# Patient Record
Sex: Female | Born: 1965 | Race: White | Hispanic: No | Marital: Married | Smoking: Never smoker
Health system: Southern US, Community
[De-identification: ages and names within clinical notes are randomized; demographics above are authoritative.]

## PROBLEM LIST (undated history)

## (undated) DIAGNOSIS — H8109 Meniere's disease, unspecified ear: Secondary | ICD-10-CM

## (undated) DIAGNOSIS — G459 Transient cerebral ischemic attack, unspecified: Secondary | ICD-10-CM

## (undated) HISTORY — PX: BREAST CYST ASPIRATION: SHX578

---

## 2004-12-30 ENCOUNTER — Ambulatory Visit: Payer: Self-pay

## 2006-03-29 ENCOUNTER — Ambulatory Visit: Payer: Self-pay | Admitting: Family Medicine

## 2006-10-25 ENCOUNTER — Ambulatory Visit: Payer: Self-pay

## 2007-04-02 ENCOUNTER — Ambulatory Visit: Payer: Self-pay | Admitting: General Surgery

## 2008-07-09 ENCOUNTER — Ambulatory Visit: Payer: Self-pay

## 2009-07-10 ENCOUNTER — Ambulatory Visit: Payer: Self-pay | Admitting: General Surgery

## 2009-07-20 ENCOUNTER — Ambulatory Visit: Payer: Self-pay | Admitting: General Surgery

## 2010-02-08 ENCOUNTER — Ambulatory Visit: Payer: Self-pay

## 2011-02-03 ENCOUNTER — Ambulatory Visit: Payer: Self-pay | Admitting: Family Medicine

## 2011-02-08 ENCOUNTER — Ambulatory Visit: Payer: Self-pay | Admitting: Family Medicine

## 2012-01-10 DIAGNOSIS — L719 Rosacea, unspecified: Secondary | ICD-10-CM | POA: Insufficient documentation

## 2012-02-10 ENCOUNTER — Ambulatory Visit: Payer: Self-pay | Admitting: Family Medicine

## 2013-02-11 ENCOUNTER — Ambulatory Visit: Payer: Self-pay | Admitting: Family Medicine

## 2013-06-09 DIAGNOSIS — E010 Iodine-deficiency related diffuse (endemic) goiter: Secondary | ICD-10-CM | POA: Insufficient documentation

## 2013-06-09 DIAGNOSIS — H9319 Tinnitus, unspecified ear: Secondary | ICD-10-CM | POA: Insufficient documentation

## 2013-06-11 DIAGNOSIS — E559 Vitamin D deficiency, unspecified: Secondary | ICD-10-CM | POA: Insufficient documentation

## 2013-06-17 ENCOUNTER — Ambulatory Visit: Payer: Self-pay | Admitting: Family Medicine

## 2013-07-10 ENCOUNTER — Ambulatory Visit: Payer: Self-pay | Admitting: Otolaryngology

## 2013-08-07 DIAGNOSIS — R519 Headache, unspecified: Secondary | ICD-10-CM | POA: Insufficient documentation

## 2013-08-07 DIAGNOSIS — R200 Anesthesia of skin: Secondary | ICD-10-CM | POA: Insufficient documentation

## 2013-08-07 DIAGNOSIS — IMO0001 Reserved for inherently not codable concepts without codable children: Secondary | ICD-10-CM | POA: Insufficient documentation

## 2013-08-07 DIAGNOSIS — R51 Headache: Secondary | ICD-10-CM

## 2013-08-07 DIAGNOSIS — R202 Paresthesia of skin: Secondary | ICD-10-CM

## 2013-08-07 DIAGNOSIS — R531 Weakness: Secondary | ICD-10-CM | POA: Insufficient documentation

## 2013-08-07 DIAGNOSIS — R42 Dizziness and giddiness: Secondary | ICD-10-CM | POA: Insufficient documentation

## 2013-08-07 DIAGNOSIS — R6889 Other general symptoms and signs: Secondary | ICD-10-CM

## 2013-12-19 DIAGNOSIS — R232 Flushing: Secondary | ICD-10-CM | POA: Insufficient documentation

## 2013-12-19 DIAGNOSIS — E876 Hypokalemia: Secondary | ICD-10-CM | POA: Insufficient documentation

## 2013-12-22 ENCOUNTER — Emergency Department: Payer: Self-pay | Admitting: Emergency Medicine

## 2013-12-22 LAB — COMPREHENSIVE METABOLIC PANEL
ALBUMIN: 4.3 g/dL (ref 3.4–5.0)
ALK PHOS: 64 U/L
Anion Gap: 8 (ref 7–16)
BUN: 17 mg/dL (ref 7–18)
Bilirubin,Total: 0.5 mg/dL (ref 0.2–1.0)
CHLORIDE: 103 mmol/L (ref 98–107)
CREATININE: 0.76 mg/dL (ref 0.60–1.30)
Calcium, Total: 9.2 mg/dL (ref 8.5–10.1)
Co2: 29 mmol/L (ref 21–32)
EGFR (African American): >60
Glucose: 98 mg/dL (ref 65–99)
OSMOLALITY: 281 (ref 275–301)
Potassium: 3.5 mmol/L (ref 3.5–5.1)
SGOT(AST): 35 U/L (ref 15–37)
SGPT (ALT): 31 U/L
Sodium: 140 mmol/L (ref 136–145)
Total Protein: 7.7 g/dL (ref 6.4–8.2)

## 2013-12-22 LAB — CBC
HCT: 43 % (ref 35.0–47.0)
HGB: 14.1 g/dL (ref 12.0–16.0)
MCH: 30.6 pg (ref 26.0–34.0)
MCHC: 32.9 g/dL (ref 32.0–36.0)
MCV: 93 fL (ref 80–100)
Platelet: 266 10*3/uL (ref 150–440)
RBC: 4.62 10*6/uL (ref 3.80–5.20)
RDW: 12.1 % (ref 11.5–14.5)
WBC: 7.2 10*3/uL (ref 3.6–11.0)

## 2013-12-22 LAB — URINALYSIS, COMPLETE
BILIRUBIN, UR: NEGATIVE
Blood: NEGATIVE
Glucose,UR: NEGATIVE mg/dL (ref 0–75)
KETONE: NEGATIVE
Leukocyte Esterase: NEGATIVE
Nitrite: NEGATIVE
PH: 7 (ref 4.5–8.0)
Protein: NEGATIVE
RBC,UR: NONE SEEN /HPF (ref 0–5)
SPECIFIC GRAVITY: 1.008 (ref 1.003–1.030)
Squamous Epithelial: <1
WBC UR: NONE SEEN /HPF (ref 0–5)

## 2013-12-22 LAB — PROTIME-INR
INR: 0.9
Prothrombin Time: 12.4 secs (ref 11.5–14.7)

## 2014-01-22 ENCOUNTER — Ambulatory Visit: Payer: Self-pay | Admitting: Family Medicine

## 2015-02-11 ENCOUNTER — Other Ambulatory Visit: Payer: Self-pay | Admitting: Family Medicine

## 2015-02-11 DIAGNOSIS — Z1231 Encounter for screening mammogram for malignant neoplasm of breast: Secondary | ICD-10-CM

## 2015-02-11 DIAGNOSIS — E041 Nontoxic single thyroid nodule: Secondary | ICD-10-CM

## 2015-02-17 ENCOUNTER — Ambulatory Visit: Payer: Self-pay

## 2015-02-26 ENCOUNTER — Ambulatory Visit
Admission: RE | Admit: 2015-02-26 | Discharge: 2015-02-26 | Disposition: A | Discharge status: Home / Self Care | Payer: 59 | Source: Ambulatory Visit | Attending: Family Medicine | Admitting: Family Medicine

## 2015-02-26 DIAGNOSIS — Z1231 Encounter for screening mammogram for malignant neoplasm of breast: Secondary | ICD-10-CM | POA: Insufficient documentation

## 2015-02-26 DIAGNOSIS — E041 Nontoxic single thyroid nodule: Secondary | ICD-10-CM | POA: Insufficient documentation

## 2015-08-31 ENCOUNTER — Emergency Department
Admission: EM | Admit: 2015-08-31 | Discharge: 2015-08-31 | Disposition: A | Discharge status: Home / Self Care | Payer: 59 | Attending: Emergency Medicine | Admitting: Emergency Medicine

## 2015-08-31 ENCOUNTER — Encounter: Payer: Self-pay | Admitting: Emergency Medicine

## 2015-08-31 ENCOUNTER — Emergency Department: Payer: 59

## 2015-08-31 DIAGNOSIS — H8109 Meniere's disease, unspecified ear: Secondary | ICD-10-CM

## 2015-08-31 DIAGNOSIS — R42 Dizziness and giddiness: Secondary | ICD-10-CM

## 2015-08-31 DIAGNOSIS — H8102 Meniere's disease, left ear: Secondary | ICD-10-CM | POA: Insufficient documentation

## 2015-08-31 DIAGNOSIS — R110 Nausea: Secondary | ICD-10-CM

## 2015-08-31 DIAGNOSIS — Z79899 Other long term (current) drug therapy: Secondary | ICD-10-CM | POA: Diagnosis not present

## 2015-08-31 DIAGNOSIS — Z8673 Personal history of transient ischemic attack (TIA), and cerebral infarction without residual deficits: Secondary | ICD-10-CM | POA: Insufficient documentation

## 2015-08-31 HISTORY — DX: Meniere's disease, unspecified ear: H81.09

## 2015-08-31 HISTORY — DX: Transient cerebral ischemic attack, unspecified: G45.9

## 2015-08-31 LAB — URINALYSIS COMPLETE WITH MICROSCOPIC (ARMC ONLY)
BILIRUBIN URINE: NEGATIVE
Glucose, UA: NEGATIVE mg/dL
HGB URINE DIPSTICK: NEGATIVE
KETONES UR: NEGATIVE mg/dL
Leukocytes, UA: NEGATIVE
NITRITE: NEGATIVE
PH: 8 (ref 5.0–8.0)
Protein, ur: NEGATIVE mg/dL
SPECIFIC GRAVITY, URINE: 1.006 (ref 1.005–1.030)

## 2015-08-31 LAB — CBC
HEMATOCRIT: 44.2 % (ref 35.0–47.0)
HEMOGLOBIN: 15.5 g/dL (ref 12.0–16.0)
MCH: 32.2 pg (ref 26.0–34.0)
MCHC: 35.2 g/dL (ref 32.0–36.0)
MCV: 91.5 fL (ref 80.0–100.0)
Platelets: 271 10*3/uL (ref 150–440)
RBC: 4.83 MIL/uL (ref 3.80–5.20)
RDW: 12.7 % (ref 11.5–14.5)
WBC: 6.9 10*3/uL (ref 3.6–11.0)

## 2015-08-31 LAB — POCT PREGNANCY, URINE: Preg Test, Ur: NEGATIVE

## 2015-08-31 LAB — BASIC METABOLIC PANEL
ANION GAP: 9 (ref 5–15)
BUN: 19 mg/dL (ref 6–20)
CHLORIDE: 108 mmol/L (ref 101–111)
CO2: 22 mmol/L (ref 22–32)
Calcium: 9.6 mg/dL (ref 8.9–10.3)
Creatinine, Ser: 0.59 mg/dL (ref 0.44–1.00)
GFR calc Af Amer: >60 mL/min (ref >60)
GLUCOSE: 97 mg/dL (ref 65–99)
POTASSIUM: 3.7 mmol/L (ref 3.5–5.1)
SODIUM: 139 mmol/L (ref 135–145)

## 2015-08-31 LAB — GLUCOSE, CAPILLARY: Glucose-Capillary: 89 mg/dL (ref 65–99)

## 2015-08-31 LAB — TROPONIN I: Troponin I: <0.03 ng/mL (ref <0.03)

## 2015-08-31 MED ORDER — MECLIZINE HCL 25 MG PO TABS: 25.0000 mg | ORAL_TABLET | Freq: Three times a day (TID) | ORAL | Status: AC | PRN

## 2015-08-31 MED ORDER — HYDROCHLOROTHIAZIDE 12.5 MG PO TABS: 12.5000 mg | ORAL_TABLET | Freq: Every day | ORAL | Status: AC

## 2015-08-31 MED ORDER — GADOBENATE DIMEGLUMINE 529 MG/ML IV SOLN
20.0000 mL | Freq: Once | INTRAVENOUS | Status: AC | PRN
  Administered 2015-08-31: 17 mL via INTRAVENOUS

## 2015-08-31 MED ORDER — ONDANSETRON 4 MG PO TBDP: 4.0000 mg | ORAL_TABLET | Freq: Three times a day (TID) | ORAL | Status: DC | PRN

## 2015-08-31 MED ORDER — MECLIZINE HCL 25 MG PO TABS
25.0000 mg | ORAL_TABLET | Freq: Once | ORAL | Status: AC
  Administered 2015-08-31: 25 mg via ORAL
  Filled 2015-08-31: qty 1

## 2015-08-31 NOTE — Discharge Instructions (Signed)
Please return to the emergency department for severe pain, worsening dizziness, numbness tingling or weakness, vomiting, fever, or any other symptoms concerning to you.

## 2015-08-31 NOTE — ED Notes (Signed)
Patient transported to MRI 

## 2015-08-31 NOTE — ED Notes (Signed)
Pt presents to ED c/o dizziness and tingling that has worsened since Saturday. Pt states that this morning it was the worst  With 4 episodes of this. Pt presents with hx of meneires disease, migraine headaches, and TIA.

## 2015-08-31 NOTE — ED Provider Notes (Addendum)
[facility] Emergency Department Provider Note  ____________________________________________  Time seen: Approximately 3:24 PM  I have reviewed the triage vital signs and the nursing notes.   HISTORY  Chief Complaint Dizziness and Tingling    HPI Caroline Burns is a 50 y.o. female with a history of Mnire's disease, atypical migraines, and TIA presenting with dizziness and facial and left upper extremity tingling. The patient has been well controlled on hydrochlorothiazide daily, meclizine when necessary, for her Mnire's disease for the past 2 years; however, she self discontinued these medications approximately 8 months ago and has been asymptomatic until one week ago when she had a 20 minute episode where "I felt like I was falling." It started when she was standing, cleaning.  She had another mild episode Thursday, and then yesterday had 3 severe episodes of a sensation of falling, dizziness, with nausea but no vomiting. Her husband had to help her throughout their daily activities. This morning, she woke up with a pressure sensation on the left side of her head "it is not a pain." From 7a-7:20am she had another severe episode of dizziness and the sensation of falling which was worse with any movement. After the severe episode resolved, she continued to have mild dizziness and unsteady gait due to the sensation of falling. She also reports associated left facial tingling and left upper extremity tingling which is similar to her previous Mnire's. Comparatively, these episodes have been similar in character to her previous Mnire's as well as her atypical migraines which she occasionally has without any pain, with the exception that they have been much more severe, and she did not previously have any associated nausea. She denies any trauma, fever, recent cough or cold symptoms, ear pain, sore throat, or new medications. At this time, the patient states that she is  "much better" and is having mild dizziness. Her nausea has resolved after EMS gave her Zofran.   Past Medical History  Diagnosis Date  . TIA (transient ischemic attack)   . Meniere disease     There are no active problems to display for this patient.   Past Surgical History  Procedure Laterality Date  . Breast cyst aspiration Right     neg    Current Outpatient Rx  Name  Route  Sig  Dispense  Refill  . nortriptyline (PAMELOR) 10 MG capsule   Oral   Take 10 mg by mouth.         . hydrochlorothiazide (HYDRODIURIL) 12.5 MG tablet   Oral   Take 1 tablet (12.5 mg total) by mouth daily.   30 tablet   0   . meclizine (ANTIVERT) 25 MG tablet   Oral   Take 1 tablet (25 mg total) by mouth 3 (three) times daily as needed for dizziness.   20 tablet   0   . ondansetron (ZOFRAN ODT) 4 MG disintegrating tablet   Oral   Take 1 tablet (4 mg total) by mouth every 8 (eight) hours as needed for nausea or vomiting.   20 tablet   0     Allergies Codeine and Neomycin  Family History  Problem Relation Age of Onset  . Breast cancer Maternal Aunt     Social History Social History  Substance Use Topics  . Smoking status: Never Smoker   . Smokeless tobacco: None  . Alcohol Use: No    Review of Systems Constitutional: No fever/chills.Negative altered mental status. Eyes: No visual changes. No blurred or double  vision. ENT: No sore throat. No congestion or rhinorrhea. No ear pain. Cardiovascular: Denies chest pain. Denies palpitations. Respiratory: Denies shortness of breath.  No cough. Gastrointestinal: No abdominal pain.  Positive nausea, no vomiting.  No diarrhea.  No constipation. Genitourinary: Negative for dysuria. No urinary frequency. Musculoskeletal: Negative for back pain. Skin: Negative for rash. Neurological: Negative for headache but does have left-sided pressure. Positive for dizziness and unsteady gait due to her dizziness. Positive for tingling on the left  face and left upper extremity. Positive for generalized weakness. Psychiatric:Positive for anxiety  10-point ROS otherwise negative.  ____________________________________________   PHYSICAL EXAM:  VITAL SIGNS: ED Triage Vitals  Enc Vitals Group     BP 08/31/15 1248 124/69 mmHg     Pulse Rate 08/31/15 1248 71     Resp 08/31/15 1248 18     Temp 08/31/15 1248 98.2 F (36.8 C)     Temp Source 08/31/15 1248 Oral     SpO2 08/31/15 1248 100 %     Weight 08/31/15 1248 183 lb (83.008 kg)     Height 08/31/15 1248 5\' 3"  (1.6 m)     Head Cir --      Peak Flow --      Pain Score 08/31/15 1418 3     Pain Loc --      Pain Edu? --      Excl. in [place]? --     Constitutional: Alert and oriented. Well appearing and in no acute distress. Answers questions appropriately.Resting comfortably in the stretcher. Eyes: Conjunctivae are normal.  EOMI. PERRLA. No scleral icterus. Head: Atraumatic. Nose: No congestion/rhinnorhea. Mouth/Throat: Mucous membranes are moist.  Neck: No stridor.  Supple.  No meningismus. Full range of motion without pain. Cardiovascular: Normal rate, regular rhythm. No murmurs, rubs or gallops.  Respiratory: Normal respiratory effort.  No accessory muscle use or retractions. Lungs CTAB.  No wheezes, rales or ronchi. Gastrointestinal: Soft, nontender and nondistended.  No guarding or rebound.  No peritoneal signs. Musculoskeletal: No LE edema. No ttp in the calves or palpable cords.  Negative Homan's sign. Neurologic: Alert and oriented 3. Speech is clear. Face and smile symmetric. Tongue is midline. EOMI. PERRLA. No nystagmus. No pronator drift. 5 out of 5 grip, biceps, triceps, hip flexors, plantar flexion and dorsiflexion. Normal sensation to light touch in the bilateral upper and lower extremities, and face. Normal heel-to-shin. Patient became symptomatic when I sat her up and then her worse when I stood her up, so is unable to assess her gait in order to maintain her  safety. Skin:  Skin is warm, dry and intact. No rash noted. Psychiatric: Mood and affect are normal. Speech and behavior are normal.  Normal judgement.  ____________________________________________   LABS (all labs ordered are listed, but only abnormal results are displayed)  Labs Reviewed  URINALYSIS COMPLETEWITH MICROSCOPIC ([place]) - Abnormal; Notable for the following:    Color, Urine STRAW (*)    APPearance HAZY (*)    Bacteria, UA MANY (*)    Squamous Epithelial / LPF 0-5 (*)    All other components within normal limits  BASIC METABOLIC PANEL  CBC  TROPONIN I  GLUCOSE, CAPILLARY  POC URINE PREG, ED  POCT PREGNANCY, URINE   ____________________________________________  EKG  ED ECG REPORT I, Eula Listen, the attending physician, personally viewed and interpreted this ECG.   Date: 08/31/2015  EKG Time: 1259  Rate: 67  Rhythm: normal sinus rhythm  Axis: normal  Intervals:none  ST&T  Change: no STEMI  ____________________________________________  RADIOLOGY  Ct Head Wo Contrast  08/31/2015  CLINICAL DATA:  Dizziness, progressing for several days.  Headaches EXAM: CT HEAD WITHOUT CONTRAST TECHNIQUE: Contiguous axial images were obtained from the base of the skull through the vertex without intravenous contrast. COMPARISON:  December 22, 2013 FINDINGS: Brain: The ventricles are normal in size and configuration. There is no intracranial mass, hemorrhage, extra-axial fluid collection, or midline shift. Gray-white compartments are normal. Vascular: No vascular lesions are evident. Skull: The bony calvarium appears intact. Sinuses/Orbits: Visualized paranasal sinuses are clear. Visualized orbits appear symmetric bilaterally. Other: Mastoid air cells are clear. IMPRESSION: Study within normal limits. Electronically Signed   By: Lowella Grip III M.D.   On: 08/31/2015 13:22   Mr Jodene Nam Head Wo Contrast  08/31/2015  CLINICAL DATA:  50 year old female with dizziness  and unsteady gait. TIAs in the past. Dizziness. Meniere's disease. Subsequent encounter. EXAM: MRI HEAD WITHOUT AND WITH CONTRAST AND MRA HEAD WITHOUT CONTRAST AND MRI NECK WITHOUT AND WITH CONTRAST TECHNIQUE: Multiplanar, multiecho pulse sequences of the brain and surrounding structures were obtained without and with intravenous contrast. Angiographic images of the head were obtained using MRA technique without contrast. Multiplanar, multiecho pulse sequences of the neck and surrounding structures were obtained without and with intravenous contrast. CONTRAST:  61mL MULTIHANCE GADOBENATE DIMEGLUMINE 529 MG/ML IV SOLN COMPARISON:  08/31/2015 CT.  07/10/2013 MR. FINDINGS: MRI HEAD FINDINGS No acute infarct or intracranial hemorrhage. 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 No intracranial mass or abnormal enhancement. Cervical medullary junction, pituitary region, pineal region and orbital structures unremarkable. 2 cm polypoid opacification inferior aspect left maxillary sinus. Minimal mucosal thickening right maxillary sinus. Minimal partial opacification inferior left mastoid air cells. MRA HEAD FINDINGS Anterior circulation without medium or large size vessel significant stenosis or occlusion. Prominent appearance of the anterior communicating artery. Although this may be normal for this patient, small aneurysm is a possibility. This can be further assessed with CT angiogram if clinically desired. Fetal type contribution to the posterior cerebral arteries. No significant stenosis of either distal vertebral artery or of the basilar artery. Nonvisualized anterior inferior cerebellar arteries. Minimal branch vessel irregularity. MRI NECK FINDINGS There may be common origin of the innominate artery and left common carotid artery. No evidence the hemodynamically significant stenosis of either carotid bifurcation. Codominant vertebral arteries without significant stenosis. Slightly irregular  vertebral arteries but without significant irregularity to suggest underlying dysplasia. IMPRESSION: MRI HEAD No acute infarct or intracranial hemorrhage. 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 No intracranial mass or abnormal enhancement. 2 cm polypoid opacification inferior aspect left maxillary sinus. Minimal mucosal thickening right maxillary sinus. Minimal partial opacification inferior left mastoid air cells. MRA HEAD Anterior circulation without medium or large size vessel significant stenosis or occlusion. Prominent appearance of the anterior communicating artery. Although this may be normal for this patient, small aneurysm is a possibility. This can be further assessed with CT angiogram if clinically desired. Fetal type contribution to the posterior cerebral arteries. No significant stenosis of either distal vertebral artery or of the basilar artery. Nonvisualized anterior inferior cerebellar arteries. MRI NECK No evidence the hemodynamically significant stenosis of either carotid bifurcation. Codominant vertebral arteries without significant stenosis. Electronically Signed   By: Genia Del M.D.   On: 08/31/2015 20:47   Mr Angiogram Neck W Wo Contrast  08/31/2015  CLINICAL DATA:  50 year old female with dizziness and unsteady gait. TIAs in the past. Dizziness. Meniere's disease. Subsequent encounter.  EXAM: MRI HEAD WITHOUT AND WITH CONTRAST AND MRA HEAD WITHOUT CONTRAST AND MRI NECK WITHOUT AND WITH CONTRAST TECHNIQUE: Multiplanar, multiecho pulse sequences of the brain and surrounding structures were obtained without and with intravenous contrast. Angiographic images of the head were obtained using MRA technique without contrast. Multiplanar, multiecho pulse sequences of the neck and surrounding structures were obtained without and with intravenous contrast. CONTRAST:  33mL MULTIHANCE GADOBENATE DIMEGLUMINE 529 MG/ML IV SOLN COMPARISON:  08/31/2015 CT.  07/10/2013 MR.  FINDINGS: MRI HEAD FINDINGS No acute infarct or intracranial hemorrhage. 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 No intracranial mass or abnormal enhancement. Cervical medullary junction, pituitary region, pineal region and orbital structures unremarkable. 2 cm polypoid opacification inferior aspect left maxillary sinus. Minimal mucosal thickening right maxillary sinus. Minimal partial opacification inferior left mastoid air cells. MRA HEAD FINDINGS Anterior circulation without medium or large size vessel significant stenosis or occlusion. Prominent appearance of the anterior communicating artery. Although this may be normal for this patient, small aneurysm is a possibility. This can be further assessed with CT angiogram if clinically desired. Fetal type contribution to the posterior cerebral arteries. No significant stenosis of either distal vertebral artery or of the basilar artery. Nonvisualized anterior inferior cerebellar arteries. Minimal branch vessel irregularity. MRI NECK FINDINGS There may be common origin of the innominate artery and left common carotid artery. No evidence the hemodynamically significant stenosis of either carotid bifurcation. Codominant vertebral arteries without significant stenosis. Slightly irregular vertebral arteries but without significant irregularity to suggest underlying dysplasia. IMPRESSION: MRI HEAD No acute infarct or intracranial hemorrhage. 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 No intracranial mass or abnormal enhancement. 2 cm polypoid opacification inferior aspect left maxillary sinus. Minimal mucosal thickening right maxillary sinus. Minimal partial opacification inferior left mastoid air cells. MRA HEAD Anterior circulation without medium or large size vessel significant stenosis or occlusion. Prominent appearance of the anterior communicating artery. Although this may be normal for this patient, small aneurysm is a  possibility. This can be further assessed with CT angiogram if clinically desired. Fetal type contribution to the posterior cerebral arteries. No significant stenosis of either distal vertebral artery or of the basilar artery. Nonvisualized anterior inferior cerebellar arteries. MRI NECK No evidence the hemodynamically significant stenosis of either carotid bifurcation. Codominant vertebral arteries without significant stenosis. Electronically Signed   By: Genia Del M.D.   On: 08/31/2015 20:47   Mr Kizzie Fantasia Contrast  08/31/2015  CLINICAL DATA:  50 year old female with dizziness and unsteady gait. TIAs in the past. Dizziness. Meniere's disease. Subsequent encounter. EXAM: MRI HEAD WITHOUT AND WITH CONTRAST AND MRA HEAD WITHOUT CONTRAST AND MRI NECK WITHOUT AND WITH CONTRAST TECHNIQUE: Multiplanar, multiecho pulse sequences of the brain and surrounding structures were obtained without and with intravenous contrast. Angiographic images of the head were obtained using MRA technique without contrast. Multiplanar, multiecho pulse sequences of the neck and surrounding structures were obtained without and with intravenous contrast. CONTRAST:  75mL MULTIHANCE GADOBENATE DIMEGLUMINE 529 MG/ML IV SOLN COMPARISON:  08/31/2015 CT.  07/10/2013 MR. FINDINGS: MRI HEAD FINDINGS No acute infarct or intracranial hemorrhage. 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 No intracranial mass or abnormal enhancement. Cervical medullary junction, pituitary region, pineal region and orbital structures unremarkable. 2 cm polypoid opacification inferior aspect left maxillary sinus. Minimal mucosal thickening right maxillary sinus. Minimal partial opacification inferior left mastoid air cells. MRA HEAD FINDINGS Anterior circulation without medium or large size vessel significant stenosis or occlusion. Prominent appearance  of the anterior communicating artery. Although this may be normal for this patient, small  aneurysm is a possibility. This can be further assessed with CT angiogram if clinically desired. Fetal type contribution to the posterior cerebral arteries. No significant stenosis of either distal vertebral artery or of the basilar artery. Nonvisualized anterior inferior cerebellar arteries. Minimal branch vessel irregularity. MRI NECK FINDINGS There may be common origin of the innominate artery and left common carotid artery. No evidence the hemodynamically significant stenosis of either carotid bifurcation. Codominant vertebral arteries without significant stenosis. Slightly irregular vertebral arteries but without significant irregularity to suggest underlying dysplasia. IMPRESSION: MRI HEAD No acute infarct or intracranial hemorrhage. 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 No intracranial mass or abnormal enhancement. 2 cm polypoid opacification inferior aspect left maxillary sinus. Minimal mucosal thickening right maxillary sinus. Minimal partial opacification inferior left mastoid air cells. MRA HEAD Anterior circulation without medium or large size vessel significant stenosis or occlusion. Prominent appearance of the anterior communicating artery. Although this may be normal for this patient, small aneurysm is a possibility. This can be further assessed with CT angiogram if clinically desired. Fetal type contribution to the posterior cerebral arteries. No significant stenosis of either distal vertebral artery or of the basilar artery. Nonvisualized anterior inferior cerebellar arteries. MRI NECK No evidence the hemodynamically significant stenosis of either carotid bifurcation. Codominant vertebral arteries without significant stenosis. Electronically Signed   By: Genia Del M.D.   On: 08/31/2015 20:47    ____________________________________________   PROCEDURES  Procedure(s) performed: None  Critical Care performed:  No ____________________________________________   INITIAL IMPRESSION / ASSESSMENT AND PLAN / ED COURSE  Pertinent labs & imaging results that were available during my care of the patient were reviewed by me and considered in my medical decision making (see chart for details).  50 y.o. female with a history of Mnire's disease currently off her medications, atypical migraines, and TIA presenting with multiple episodes of dizziness, the sensation of falling, unsteady gait, left facial and left upper extremity tingling, nausea without vomiting. The patient has reassuring vital signs and I do not have any abnormalities on my neurologic examination except for that the patient become symptomatically she sits or stands up. She has no obvious ataxia or cerebellar dysfunction. It is possible that the patient has a recurrence of her Mnire's disease, and I will treat her with meclizine and reevaluate her symptoms. However, given her history of TIA and her age, I will get an MRI to evaluate for acute stroke, which is less likely but also possible. The patient's labs from triage are reassuring that her symptoms are not due to an electrolyte abnormality or severe anemia. Her urinalysis is pending. I will also get orthostatics, pregnancy test. If the patient's workup is reassuring, my plan is to be in touch with the patient's ENT physician who follows her Mnire's disease, and make an outpatient plan as long as the patient is safe to ambulate.  ----------------------------------------- 9:07 PM on 08/31/2015 -----------------------------------------  Throughout the patient's clinical course, she is continued to feel better and better. She noticed a significant improvement with meclizine. She was able to ambulate with minimal symptoms.  The patient underwent MRI and MRA of the head and neck, and there are no new findings. 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  She does not have any evidence of new posterior stroke.  At this time, the patient has been excluded from any acute  emergent neurologic conditions. I'll plan to send her home with treatment for her Mnire's disease, and she will call her ENT physician for close follow-up. I will place a call to her ENT physicians group, to let them know that she was here and the workup that she had done.  She and her husband understand return precautions as well as follow-up instructions.  ____________________________________________  FINAL CLINICAL IMPRESSION(S) / ED DIAGNOSES  Final diagnoses:  Meniere disease, unspecified laterality  Nausea without vomiting      NEW MEDICATIONS STARTED DURING THIS VISIT:  New Prescriptions   HYDROCHLOROTHIAZIDE (HYDRODIURIL) 12.5 MG TABLET    Take 1 tablet (12.5 mg total) by mouth daily.   MECLIZINE (ANTIVERT) 25 MG TABLET    Take 1 tablet (25 mg total) by mouth 3 (three) times daily as needed for dizziness.   ONDANSETRON (ZOFRAN ODT) 4 MG DISINTEGRATING TABLET    Take 1 tablet (4 mg total) by mouth every 8 (eight) hours as needed for nausea or vomiting.     Eula Listen, MD 08/31/15 2109  Eula Listen, MD 08/31/15 2113

## 2015-08-31 NOTE — ED Notes (Signed)
Pt returned from CT °

## 2015-10-14 DIAGNOSIS — G44229 Chronic tension-type headache, not intractable: Secondary | ICD-10-CM | POA: Insufficient documentation

## 2016-01-19 ENCOUNTER — Other Ambulatory Visit: Payer: Self-pay | Admitting: Family Medicine

## 2016-01-19 DIAGNOSIS — Z1231 Encounter for screening mammogram for malignant neoplasm of breast: Secondary | ICD-10-CM

## 2016-01-20 DIAGNOSIS — I639 Cerebral infarction, unspecified: Secondary | ICD-10-CM | POA: Insufficient documentation

## 2016-10-25 ENCOUNTER — Other Ambulatory Visit: Payer: Self-pay | Admitting: Acute Care

## 2016-10-25 DIAGNOSIS — R202 Paresthesia of skin: Secondary | ICD-10-CM

## 2016-10-25 DIAGNOSIS — G44229 Chronic tension-type headache, not intractable: Secondary | ICD-10-CM

## 2016-10-25 DIAGNOSIS — R531 Weakness: Secondary | ICD-10-CM

## 2016-10-25 DIAGNOSIS — R200 Anesthesia of skin: Secondary | ICD-10-CM

## 2016-10-25 DIAGNOSIS — R42 Dizziness and giddiness: Secondary | ICD-10-CM

## 2016-10-27 ENCOUNTER — Other Ambulatory Visit: Payer: Self-pay | Admitting: Acute Care

## 2016-10-27 DIAGNOSIS — R42 Dizziness and giddiness: Secondary | ICD-10-CM

## 2016-10-27 DIAGNOSIS — R531 Weakness: Secondary | ICD-10-CM

## 2016-10-27 DIAGNOSIS — G44229 Chronic tension-type headache, not intractable: Secondary | ICD-10-CM

## 2016-10-27 DIAGNOSIS — R202 Paresthesia of skin: Secondary | ICD-10-CM

## 2016-10-27 DIAGNOSIS — R200 Anesthesia of skin: Secondary | ICD-10-CM

## 2016-10-27 DIAGNOSIS — I729 Aneurysm of unspecified site: Secondary | ICD-10-CM

## 2016-10-31 ENCOUNTER — Ambulatory Visit: Payer: 59

## 2016-10-31 ENCOUNTER — Ambulatory Visit
Admission: RE | Admit: 2016-10-31 | Discharge: 2016-10-31 | Disposition: A | Discharge status: Home / Self Care | Payer: BLUE CROSS/BLUE SHIELD | Source: Ambulatory Visit | Attending: Acute Care | Admitting: Acute Care

## 2016-10-31 DIAGNOSIS — G44229 Chronic tension-type headache, not intractable: Secondary | ICD-10-CM

## 2016-10-31 DIAGNOSIS — R200 Anesthesia of skin: Secondary | ICD-10-CM

## 2016-10-31 DIAGNOSIS — I729 Aneurysm of unspecified site: Secondary | ICD-10-CM | POA: Insufficient documentation

## 2016-10-31 DIAGNOSIS — R42 Dizziness and giddiness: Secondary | ICD-10-CM | POA: Diagnosis present

## 2016-10-31 DIAGNOSIS — R202 Paresthesia of skin: Secondary | ICD-10-CM

## 2016-10-31 DIAGNOSIS — R531 Weakness: Secondary | ICD-10-CM

## 2016-10-31 MED ORDER — IOPAMIDOL (ISOVUE-370) INJECTION 76%
75.0000 mL | Freq: Once | INTRAVENOUS | Status: AC | PRN
  Administered 2016-10-31: 75 mL via INTRAVENOUS

## 2016-11-17 ENCOUNTER — Ambulatory Visit
Admission: RE | Admit: 2016-11-17 | Discharge: 2016-11-17 | Disposition: A | Discharge status: Home / Self Care | Payer: BLUE CROSS/BLUE SHIELD | Source: Ambulatory Visit | Attending: Family Medicine | Admitting: Family Medicine

## 2016-11-17 DIAGNOSIS — Z1231 Encounter for screening mammogram for malignant neoplasm of breast: Secondary | ICD-10-CM | POA: Diagnosis present

## 2016-12-06 ENCOUNTER — Other Ambulatory Visit: Payer: Self-pay | Admitting: Family Medicine

## 2016-12-06 DIAGNOSIS — E041 Nontoxic single thyroid nodule: Secondary | ICD-10-CM

## 2016-12-15 ENCOUNTER — Ambulatory Visit
Admission: RE | Admit: 2016-12-15 | Discharge: 2016-12-15 | Disposition: A | Discharge status: Home / Self Care | Payer: BLUE CROSS/BLUE SHIELD | Source: Ambulatory Visit | Attending: Family Medicine | Admitting: Family Medicine

## 2016-12-15 DIAGNOSIS — E041 Nontoxic single thyroid nodule: Secondary | ICD-10-CM | POA: Diagnosis not present

## 2017-05-23 DIAGNOSIS — L570 Actinic keratosis: Secondary | ICD-10-CM | POA: Insufficient documentation

## 2017-11-17 DIAGNOSIS — M5136 Other intervertebral disc degeneration, lumbar region: Secondary | ICD-10-CM | POA: Insufficient documentation

## 2017-12-05 ENCOUNTER — Other Ambulatory Visit: Payer: Self-pay | Admitting: Family Medicine

## 2017-12-05 DIAGNOSIS — Z1231 Encounter for screening mammogram for malignant neoplasm of breast: Secondary | ICD-10-CM

## 2017-12-19 ENCOUNTER — Ambulatory Visit: Payer: BLUE CROSS/BLUE SHIELD

## 2017-12-26 ENCOUNTER — Ambulatory Visit
Admission: RE | Admit: 2017-12-26 | Discharge: 2017-12-26 | Disposition: A | Discharge status: Home / Self Care | Payer: BLUE CROSS/BLUE SHIELD | Source: Ambulatory Visit | Attending: Family Medicine | Admitting: Family Medicine

## 2017-12-26 ENCOUNTER — Encounter (INDEPENDENT_AMBULATORY_CARE_PROVIDER_SITE_OTHER): Payer: Self-pay

## 2017-12-26 DIAGNOSIS — Z1231 Encounter for screening mammogram for malignant neoplasm of breast: Secondary | ICD-10-CM | POA: Diagnosis present

## 2018-01-05 DIAGNOSIS — E6609 Other obesity due to excess calories: Secondary | ICD-10-CM | POA: Insufficient documentation

## 2018-01-05 DIAGNOSIS — Z6833 Body mass index (BMI) 33.0-33.9, adult: Secondary | ICD-10-CM

## 2018-01-31 ENCOUNTER — Ambulatory Visit (INDEPENDENT_AMBULATORY_CARE_PROVIDER_SITE_OTHER): Payer: BLUE CROSS/BLUE SHIELD | Admitting: Podiatry

## 2018-01-31 ENCOUNTER — Other Ambulatory Visit: Payer: Self-pay | Admitting: Podiatry

## 2018-01-31 ENCOUNTER — Ambulatory Visit (INDEPENDENT_AMBULATORY_CARE_PROVIDER_SITE_OTHER): Payer: BLUE CROSS/BLUE SHIELD

## 2018-01-31 ENCOUNTER — Encounter: Payer: Self-pay | Admitting: Podiatry

## 2018-01-31 VITALS — BP 123/75 | HR 68 | Resp 16

## 2018-01-31 DIAGNOSIS — M2012 Hallux valgus (acquired), left foot: Secondary | ICD-10-CM

## 2018-01-31 DIAGNOSIS — M2011 Hallux valgus (acquired), right foot: Secondary | ICD-10-CM | POA: Diagnosis not present

## 2018-01-31 DIAGNOSIS — M21622 Bunionette of left foot: Secondary | ICD-10-CM | POA: Diagnosis not present

## 2018-01-31 DIAGNOSIS — M79672 Pain in left foot: Secondary | ICD-10-CM

## 2018-01-31 DIAGNOSIS — G43909 Migraine, unspecified, not intractable, without status migrainosus: Secondary | ICD-10-CM | POA: Insufficient documentation

## 2018-01-31 DIAGNOSIS — M79671 Pain in right foot: Secondary | ICD-10-CM

## 2018-01-31 DIAGNOSIS — I499 Cardiac arrhythmia, unspecified: Secondary | ICD-10-CM | POA: Insufficient documentation

## 2018-01-31 DIAGNOSIS — J302 Other seasonal allergic rhinitis: Secondary | ICD-10-CM | POA: Insufficient documentation

## 2018-01-31 NOTE — Patient Instructions (Addendum)
Bunion A bunion is a bump on the base of the big toe that forms when the bones of the big toe joint move out of position. Bunions may be small at first, but they often get larger over time. The can make walking painful. What are the causes? A bunion may be caused by:  Wearing narrow or pointed shoes that force the big toe to press against the other toes.  Abnormal foot development that causes the foot to roll inward (pronate).  Changes in the foot that are caused by certain diseases, such as rheumatoid arthritis and polio.  A foot injury.  What increases the risk? The following factors may make you more likely to develop this condition:  Wearing shoes that squeeze the toes together.  Having certain diseases, such as: ? Rheumatoid arthritis. ? Polio. ? Cerebral palsy.  Having family members who have bunions.  Being born with a foot deformity, such as flat feet or low arches.  Doing activities that put a lot of pressure on the feet, such as ballet dancing.  What are the signs or symptoms? The main symptom of a bunion is a noticeable bump on the big toe. Other symptoms may include:  Pain.  Swelling around the big toe.  Redness and inflammation.  Thick or hardened skin on the big toe or between the toes.  Stiffness or loss of motion in the big toe.  Trouble with walking.  How is this diagnosed? A bunion may be diagnosed based on your symptoms, medical history, and activities. You may have tests, such as:  X-rays. These allow your health care provider to check the position of the bones in your foot and look for damage to your joint. They also help your health care provider to determine the severity of your bunion and the best way to treat it.  Joint aspiration. In this test, a sample of fluid is removed from the toe joint. This test, which may be done if you are in a lot of pain, helps to rule out diseases that cause painful swelling of the joints, such as  arthritis.  How is this treated? There is no cure for a bunion, but treatment can help to prevent a bunion from getting worse. Treatment depends on the severity of your symptoms. Your health care provider may recommend:  Wearing shoes that have a wide toe box.  Using bunion pads to cushion the affected area.  Taping your toes together to keep them in a normal position.  Placing a device inside your shoe (orthotics) to help reduce pressure on your toe joint.  Taking medicine to ease pain, inflammation, and swelling.  Applying heat or ice to the affected area.  Doing stretching exercises.  Surgery to remove scar tissue and move the toes back into their normal position. This treatment is rare.  Follow these instructions at home:  Support your toe joint with proper footwear, shoe padding, or taping as told by your health care provider.  Take over-the-counter and prescription medicines only as told by your health care provider.  If directed, apply ice to the injured area: ? Put ice in a plastic bag. ? Place a towel between your skin and the bag. ? Leave the ice on for 20 minutes, 2-3 times per day.  If directed, apply heat to the affected area before you exercise. Use the heat source that your health care provider recommends, such as a moist heat pack or a heating pad. ? Place a towel between your   skin and the heat source. ? Leave the heat on for 20-30 minutes. ? Remove the heat if your skin turns bright red. This is especially important if you are unable to feel pain, heat, or cold. You may have a greater risk of getting burned.  Do exercises as told by your health care provider.  Keep all follow-up visits as told by your health care provider. Contact a health care provider if:  Your symptoms get worse.  Your symptoms do not improve in 2 weeks. Get help right away if:  You have severe pain and trouble with walking. This information is not intended to replace advice given  to you by your health care provider. Make sure you discuss any questions you have with your health care provider. Document Released: 02/07/2005 Document Revised: 07/16/2015 Document Reviewed: 09/07/2014 Elsevier Interactive Patient Education  2018 Elsevier Inc[employer]  Pre-Operative Instructions  Congratulations, you have decided to take an important step towards improving your quality of life.  You can be assured that the doctors and staff at [facility] will be with you every step of the way.  Here are some important things you should know:  1. Plan to be at the surgery center/hospital at least 1 (one) hour prior to your scheduled time, unless otherwise directed by the surgical center/hospital staff.  You must have a responsible adult accompany you, remain during the surgery and drive you home.  Make sure you have directions to the surgical center/hospital to ensure you arrive on time. 2. If you are having surgery at [facility] or [facility], you will need a copy of your medical history and physical form from your family physician within one month prior to the date of surgery. We will give you a form for your primary physician to complete.  3. We make every effort to accommodate the date you request for surgery.  However, there are times where surgery dates or times have to be moved.  We will contact you as soon as possible if a change in schedule is required.   4. No aspirin/ibuprofen for one week before surgery.  If you are on aspirin, any non-steroidal anti-inflammatory medications (Mobic, Aleve, Ibuprofen) should not be taken seven (7) days prior to your surgery.  You make take Tylenol for pain prior to surgery.  5. Medications - If you are taking daily heart and blood pressure medications, seizure, reflux, allergy, asthma, anxiety, pain or diabetes medications, make sure you notify the surgery center/hospital before the day of surgery so they can tell you which medications you should  take or avoid the day of surgery. 6. No food or drink after midnight the night before surgery unless directed otherwise by surgical center/hospital staff. 7. No alcoholic beverages 24-hours prior to surgery.  No smoking 24-hours prior or 24-hours after surgery. 8. Wear loose pants or shorts. They should be loose enough to fit over bandages, boots, and casts. 9. Don't wear slip-on shoes. Sneakers are preferred. 10. Bring your boot with you to the surgery center/hospital.  Also bring crutches or a walker if your physician has prescribed it for you.  If you do not have this equipment, it will be provided for you after surgery. 11. If you have not been contacted by the surgery center/hospital by the day before your surgery, call to confirm the date and time of your surgery. 12. Leave-time from work may vary depending on the type of surgery you have.  Appropriate arrangements should be made prior   to surgery with your employer. 13. Prescriptions will be provided immediately following surgery by your doctor.  Fill these as soon as possible after surgery and take the medication as directed. Pain medications will not be refilled on weekends and must be approved by the doctor. 14. Remove nail polish on the operative foot and avoid getting pedicures prior to surgery. 15. Wash the night before surgery.  The night before surgery wash the foot and leg well with water and the antibacterial soap provided. Be sure to pay special attention to beneath the toenails and in between the toes.  Wash for at least three (3) minutes. Rinse thoroughly with water and dry well with a towel.  Perform this wash unless told not to do so by your physician.  Enclosed: 1 Ice pack (please put in freezer the night before surgery)   1 Hibiclens skin cleaner   Pre-op instructions  If you have any questions regarding the instructions, please do not hesitate to call our office.  [place]: [address], [place], [place] [address] --  [phone]  [place]: [address] [place], [place] [address] -- [phone]  [place]: [address]  [place], [place] [address] -- [phone]  [place]: [address], [address], [place], [place] [address] -- [phone]  Website: [email or web address]  

## 2018-01-31 NOTE — Progress Notes (Signed)
Subjective:   Patient ID: Caroline Burns, female   DOB: 52 y.o.   MRN: [id number]   HPI Patient presents with painful bunion deformity left and pain in the outside of the left foot on the bone.  Also has arch pain and does wear orthotics and states that she has flatfoot structure and has history of bunion deformity in the family.  Patient states is been going on several years she is tried wider shoes she is tried soaks without relief of her symptoms.  Patient does not smoke and likes to be active   Review of Systems  All other systems reviewed and are negative.       Objective:  Physical Exam  Constitutional: She appears well-developed and well-nourished.  Cardiovascular: Intact distal pulses.  Pulmonary/Chest: Effort normal.  Musculoskeletal: Normal range of motion.  Neurological: She is alert.  Skin: Skin is warm.  Nursing note and vitals reviewed.   Neurovascular status intact muscle strength is adequate range of motion within normal limits with patient found to have hyperostosis medial aspect first metatarsal head left lateral side fifth metatarsal left with redness around both areas with pain with palpation.  Patient is noted to have good digital perfusion well oriented x3 with mild arch pain and depression of the arch noted bilateral.     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  I have recommended surgical intervention and I reviewed this with patient and explained distal osteotomy along with metatarsal osteotomy fifth.  Patient wants surgery and needs to wait till January and at this time I educated her on the surgery and what would be required and she will reappoint for consult and have surgery tentatively the first several weeks of January.  She is encouraged to call with any questions she would have prior to her next  visit  X-ray indicates elevation of the intermetatarsal angle left over right and irritation around the fifth metatarsal head left with moderate flatfoot deformity noted

## 2018-01-31 NOTE — Progress Notes (Signed)
   Subjective:    Patient ID: Caroline Burns, female    DOB: 1966-02-11, 52 y.o.   MRN: [id number]  HPI    Review of Systems  All other systems reviewed and are negative.      Objective:   Physical Exam        Assessment & Plan:

## 2018-01-31 NOTE — Progress Notes (Signed)
dg 

## 2018-02-23 ENCOUNTER — Ambulatory Visit (INDEPENDENT_AMBULATORY_CARE_PROVIDER_SITE_OTHER): Payer: BLUE CROSS/BLUE SHIELD | Admitting: Podiatry

## 2018-02-23 ENCOUNTER — Encounter: Payer: Self-pay | Admitting: Podiatry

## 2018-02-23 DIAGNOSIS — M2011 Hallux valgus (acquired), right foot: Secondary | ICD-10-CM

## 2018-02-23 DIAGNOSIS — M21622 Bunionette of left foot: Secondary | ICD-10-CM

## 2018-02-23 NOTE — Patient Instructions (Signed)
Pre-Operative Instructions  Congratulations, you have decided to take an important step towards improving your quality of life.  You can be assured that the doctors and staff at [facility] will be with you every step of the way.  Here are some important things you should know:  1. Plan to be at the surgery center/hospital at least 1 (one) hour prior to your scheduled time, unless otherwise directed by the surgical center/hospital staff.  You must have a responsible adult accompany you, remain during the surgery and drive you home.  Make sure you have directions to the surgical center/hospital to ensure you arrive on time. 2. If you are having surgery at [facility] or [facility], you will need a copy of your medical history and physical form from your family physician within one month prior to the date of surgery. We will give you a form for your primary physician to complete.  3. We make every effort to accommodate the date you request for surgery.  However, there are times where surgery dates or times have to be moved.  We will contact you as soon as possible if a change in schedule is required.   4. No aspirin/ibuprofen for one week before surgery.  If you are on aspirin, any non-steroidal anti-inflammatory medications (Mobic, Aleve, Ibuprofen) should not be taken seven (7) days prior to your surgery.  You make take Tylenol for pain prior to surgery.  5. Medications - If you are taking daily heart and blood pressure medications, seizure, reflux, allergy, asthma, anxiety, pain or diabetes medications, make sure you notify the surgery center/hospital before the day of surgery so they can tell you which medications you should take or avoid the day of surgery. 6. No food or drink after midnight the night before surgery unless directed otherwise by surgical center/hospital staff. 7. No alcoholic beverages 24-hours prior to surgery.  No smoking 24-hours prior or 24-hours after  surgery. 8. Wear loose pants or shorts. They should be loose enough to fit over bandages, boots, and casts. 9. Don't wear slip-on shoes. Sneakers are preferred. 10. Bring your boot with you to the surgery center/hospital.  Also bring crutches or a walker if your physician has prescribed it for you.  If you do not have this equipment, it will be provided for you after surgery. 11. If you have not been contacted by the surgery center/hospital by the day before your surgery, call to confirm the date and time of your surgery. 12. Leave-time from work may vary depending on the type of surgery you have.  Appropriate arrangements should be made prior to surgery with your employer. 13. Prescriptions will be provided immediately following surgery by your doctor.  Fill these as soon as possible after surgery and take the medication as directed. Pain medications will not be refilled on weekends and must be approved by the doctor. 14. Remove nail polish on the operative foot and avoid getting pedicures prior to surgery. 15. Wash the night before surgery.  The night before surgery wash the foot and leg well with water and the antibacterial soap provided. Be sure to pay special attention to beneath the toenails and in between the toes.  Wash for at least three (3) minutes. Rinse thoroughly with water and dry well with a towel.  Perform this wash unless told not to do so by your physician.  Enclosed: 1 Ice pack (please put in freezer the night before surgery)   1 Hibiclens skin cleaner     Pre-op instructions  If you have any questions regarding the instructions, please do not hesitate to call our office.  [place]: [address], [place], [place] [address] -- [phone]  [place]: [address] [place], [place] [address] -- [phone]  [place]: [address]  [place], [place] [address] -- [phone]  [place]: [address], [place], [place] [address] -- [phone]  Website:  [email or web address] 

## 2018-02-24 NOTE — Progress Notes (Signed)
Subjective:   Patient ID: Caroline Burns, female   DOB: 53 y.o.   MRN: [id number]   HPI Patient presents stating that she is having some pain in her neck and she is going to the doctor on Monday and we may have to hold off for a week or 2 on the surgery.  Patient states she is very motivated to get her foot fixed   ROS      Objective:  Physical Exam  Neurovascular status intact with patient noted to have prominence of the first metatarsal left fifth metatarsal left with redness around the joint surfaces and pain with palpation.  Patient is found to have good digital perfusion and is well oriented x3     Assessment:  HAV deformity left tailor's bunion deformity left with possibility of some kind of strain of her neck left that she is having checked     Plan:  H&P performed reviewed x-rays and allowed her to read consent form going over alternative treatments complications associated with procedures.  Patient wants surgery and after extensive review of signs the consent form understanding told her correction can take 6 months to 1 year.  Patient is scheduled for outpatient surgery and will call us with the results from seeing her family physician on Monday and we may hold off and wait several weeks to do the procedure.  Patient is encouraged to call us with any questions concerns and will have this performed at one point in January depending on the results of her neck issues.  Patient had air fracture walker dispensed with all instructions on usage and she will get used to it prior to procedure  X-rays indicate that there is structural deformity first and fifth metatarsal left which was reviewed with patient

## 2018-02-26 ENCOUNTER — Telehealth: Payer: Self-pay | Admitting: *Deleted

## 2018-02-26 NOTE — Telephone Encounter (Signed)
"  I was scheduled to have surgery tomorrow but Dr. Paulla Dolly wanted me to have my neck checked before I preceded.  I went for my appointment and my doctor said he does not recommend that I have surgery right now.  He said he wants to run some additional tests.  I will call to reschedule once I find out the results."  I'll let Dr. Paulla Dolly know and I hope everything goes well.  "Thank you so much."

## 2018-03-01 ENCOUNTER — Other Ambulatory Visit: Payer: Self-pay | Admitting: Medical Oncology

## 2018-03-01 DIAGNOSIS — R221 Localized swelling, mass and lump, neck: Secondary | ICD-10-CM

## 2018-03-01 DIAGNOSIS — R42 Dizziness and giddiness: Secondary | ICD-10-CM

## 2018-03-05 ENCOUNTER — Other Ambulatory Visit: Payer: Self-pay | Admitting: Medical Oncology

## 2018-03-05 DIAGNOSIS — R221 Localized swelling, mass and lump, neck: Secondary | ICD-10-CM

## 2018-03-05 DIAGNOSIS — R42 Dizziness and giddiness: Secondary | ICD-10-CM

## 2018-03-06 ENCOUNTER — Ambulatory Visit
Admission: RE | Admit: 2018-03-06 | Discharge: 2018-03-06 | Disposition: A | Discharge status: Home / Self Care | Payer: BLUE CROSS/BLUE SHIELD | Source: Ambulatory Visit | Attending: Medical Oncology | Admitting: Medical Oncology

## 2018-03-06 DIAGNOSIS — R42 Dizziness and giddiness: Secondary | ICD-10-CM | POA: Diagnosis present

## 2018-03-06 DIAGNOSIS — R221 Localized swelling, mass and lump, neck: Secondary | ICD-10-CM | POA: Insufficient documentation

## 2018-03-07 ENCOUNTER — Other Ambulatory Visit: Payer: BLUE CROSS/BLUE SHIELD

## 2018-03-13 ENCOUNTER — Telehealth: Payer: Self-pay | Admitting: Podiatry

## 2018-03-13 NOTE — Telephone Encounter (Signed)
My surgery on my left foot was postponed to have testing done on my throat. I wanted to know if I needed to see the neurologist before surgery as everything else has come back clear.

## 2018-03-14 ENCOUNTER — Telehealth: Payer: Self-pay | Admitting: *Deleted

## 2018-03-14 NOTE — Telephone Encounter (Signed)
"  I'm calling to let you know that Dr. Paulla Dolly said you do not need to see a Neurologist.  He said you can schedule your surgery.  He actually had a cancellation for next week, if you would like to do it then.  "That would be great.  I have a new grand-baby coming soon, so I'd like to go ahead and get this over with."  I'll get you scheduled.  "Will someone from the surgery center call me on Monday and give me my time?"  Yes, either Monday or this Friday, someone will give you a call.

## 2018-03-14 NOTE — Telephone Encounter (Signed)
"  I just got off the phone with my primary care doctor.  He said he does not want me to have surgery until after my testing with a Neurologist due to my past history of mini strokes.  My appointment with the Neurologist isn't until February 28.  So, I'm going to hold off on surgery for now."  I'll cancel the surgery and I'll let Dr. Paulla Dolly know.  I canceled the surgery in [facility] [employer].

## 2018-03-20 ENCOUNTER — Telehealth: Payer: Self-pay | Admitting: *Deleted

## 2018-03-20 NOTE — Telephone Encounter (Signed)
Pt called states she would like another shot in her foot, since she is having to wait another month for her surgery.

## 2018-03-21 ENCOUNTER — Encounter: Payer: Self-pay | Admitting: Podiatry

## 2018-03-21 ENCOUNTER — Ambulatory Visit (INDEPENDENT_AMBULATORY_CARE_PROVIDER_SITE_OTHER): Payer: BLUE CROSS/BLUE SHIELD | Admitting: Podiatry

## 2018-03-21 DIAGNOSIS — D3613 Benign neoplasm of peripheral nerves and autonomic nervous system of lower limb, including hip: Secondary | ICD-10-CM | POA: Diagnosis not present

## 2018-03-21 DIAGNOSIS — M779 Enthesopathy, unspecified: Secondary | ICD-10-CM

## 2018-03-21 DIAGNOSIS — D3610 Benign neoplasm of peripheral nerves and autonomic nervous system, unspecified: Secondary | ICD-10-CM

## 2018-03-21 MED ORDER — TRIAMCINOLONE ACETONIDE 10 MG/ML IJ SUSP
10.0000 mg | Freq: Once | INTRAMUSCULAR | Status: AC
  Administered 2018-03-21: 10 mg

## 2018-03-21 NOTE — Telephone Encounter (Signed)
I informed pt of Dr. Mellody Drown offer of an appt today. Pt accepted appt today at 3:15pm.

## 2018-03-21 NOTE — Telephone Encounter (Signed)
She can come in today 

## 2018-03-22 NOTE — Progress Notes (Signed)
Subjective:   Patient ID: Caroline Burns, female   DOB: 53 y.o.   MRN: [id number]   HPI Patient presents stating she is very excited to have surgery on her left foot that is good I have to hold off until March and she is having a lot of pain and is developed some radiating pains between her second and third toes and also has a lot of discomfort on the outside of the left bone stating that so far all of her test have come back negative but she is due to see a neurologist the end of the month   ROS      Objective:  Physical Exam  Neurovascular status was intact with patient found to have inflammation second interspace left with mild shooting into the adjacent digits of relative short-term duration.  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  I went ahead did a sterile prep of the left second interspace and I did an injection of the nerve with 3 mg Dexasone Kenalog 5 mg Xylocaine and I then prepped the fifth MPJ left I did careful injection of 2 mg dexamethasone Kenalog 5 mg Xylocaine to reduce inflammation.  Patient will be seen back to recheck

## 2018-04-10 ENCOUNTER — Telehealth: Payer: Self-pay | Admitting: *Deleted

## 2018-04-10 NOTE — Telephone Encounter (Signed)
"  I just left my Neurologist.  He said I was cleared to have surgery.  All of my test have come back negative.  I am cleared to have surgery.  So, I'm calling to see if I can get my surgery scheduled with Dr. Paulla Dolly."  Can you call your doctors' offices and get them to release your results to [place]?  "Yes, I'll give them a call."  "I'm calling to let them know I've been passed to have the surgery.  I was told I need to get them to fill out a form or something.  What do I need to do about it?  Does it need to be sent to me?  Give me a call."

## 2018-04-11 NOTE — Telephone Encounter (Signed)
I am returning.  We do not have a form that needs to be filled out.  We just need a statement from the doctor stating you are cleared to have the surgery.  They can fax [place] the clinicals or a statement stating you are cleared.  "Oh okay, you just need something written stating I'm cleared to have the surgery.  I will let my primary doctor know.  Is the pcp the only one you need it from?"  Dr. Paulla Dolly would like it from your Neurologist as well.  Our fax number is [phone].  "Okay, I'll get them to send it to you."

## 2018-04-11 NOTE — Telephone Encounter (Signed)
"  I need a form submitted that I can send to my primary doctor to have them fill out saying I'm okay to pass for the surgery I need on my foot.  I'm not sure what type of form.  They told me there was a form you guys need to send them."

## 2018-04-16 ENCOUNTER — Telehealth: Payer: Self-pay | Admitting: *Deleted

## 2018-04-16 NOTE — Telephone Encounter (Signed)
"  I'm calling back in reference to the clearance I need for my foot surgery.  [facility], which is my regular doctor, said they don't have access to my ENT doctor, Dr. Lula Olszewski nor the Neurologist, at [facility] but Dr. Paulla Dolly has access.  So I do not have anything else I can do to clear unless you give me letter requesting it.  Give me a call back."

## 2018-04-20 ENCOUNTER — Encounter: Payer: Self-pay | Admitting: *Deleted

## 2018-04-20 NOTE — Telephone Encounter (Signed)
"  I am just wondering if Dr. Paulla Burns had a chance to view my notes from my Neurologist at [facility], also the ENT doctor, and also did you get anything from [facility] for the okay to go ahead and have my surgery.  I'm just waiting to hear from you guys when the appointment will be.  Thank you."  I am returning your call.  We can see your ultrasound results.  We can see your messages with [facility] Everywhere.  They are waiting on a response from the Neurologist before they will give clearance for your surgery.  I sent a medical clearance letter to Dr. Salome Burns at [facility] and your Neurologist, Dr. Melrose Burns.  Once we get the clearance we will call you to get the surgery scheduled.  "Okay, so I guess there is nothing else I need to do on my part.  I guess it's just a wait and see response."  We will call you once we get a response.

## 2018-04-23 NOTE — Telephone Encounter (Signed)
We should be able to get this booked soon as it sounds like her test results have turned out fine. Please follow up

## 2018-04-27 ENCOUNTER — Telehealth: Payer: Self-pay | Admitting: *Deleted

## 2018-04-27 NOTE — Telephone Encounter (Signed)
"  I am calling to see if you received the information you needed from [facility].  Have they faxed you anything and has anything gone through?  If not may need to call them back or whatever to get on the case again.  Call me back."

## 2018-05-04 NOTE — Telephone Encounter (Signed)
"  I was just wondering if they received the papers from all the testing I had done since January and February.  I haven't heard from anyone in a while.  Is the surgery going to be soon?  Are they waiting for a schedule?  Is it going to be a while?  If someone would let me know, I'd appreciate it."

## 2018-05-07 NOTE — Telephone Encounter (Addendum)
I am returning your call.  We still have not heard anything from Dr. Iona Beard.  I sent Dr. Iona Beard a medical clearance letter and I haven't received a response.  "I'll giveher a call tomorrow.  It's been difficult for me to get in contact with Dr. Iona Beard but I'll keep trying.  We received the clearance from Dr. Melrose Nakayama.  I faxed the letter to Dr. Iona Beard again.

## 2018-08-30 ENCOUNTER — Encounter: Payer: Self-pay | Admitting: *Deleted

## 2018-08-30 NOTE — Progress Notes (Signed)
I am attempting again to get medical clearance from Dr. Salome Holmes and Dr. Gurney Maxin for the patient in order for the patient to have foot surgery.

## 2018-08-31 NOTE — Telephone Encounter (Signed)
I sent medical clearance request letters to Dr. Iona Beard and Dr. Melrose Nakayama.

## 2018-09-04 ENCOUNTER — Telehealth: Payer: Self-pay | Admitting: Podiatry

## 2018-09-04 NOTE — Telephone Encounter (Signed)
I was calling to see if you got the okay from [facility], Dr. Iona Beard to go ahead and have my foot surgery. I am seeing her this week and will mention it if they have not received it. Please return my call. Thank you.

## 2018-09-07 NOTE — Telephone Encounter (Addendum)
I'm returning your call.  We received the medical clearance from your Neurologist, Dr. Gurney Maxin but we have not received one from Dr. Iona Beard.  "I was supposed to have had an appointment with her yesterday but I had to end up canceling it.  I've been trying to call her office but it's so hard to get through to someone.  I'm thinking about just going over there to see if I can come up with something.  I'll hopefully let you know something soon.  I need to do it by the end of the month because my husband just lost his job."

## 2019-03-18 IMAGING — US US SOFT TISSUE HEAD/NECK
1 series · 11 of 11 positions shown · non-contrast
Comparison: Prior thyroid ultrasound 12/15/2016

CLINICAL DATA: 52-year-old female with discomfort and an odd
sensation in the left side of the neck when swallowing and turning
her head

EXAM:
ULTRASOUND OF HEAD/NECK SOFT TISSUES
TECHNIQUE: Ultrasound examination of the head and neck soft tissues was
performed in the area of clinical concern.

[Series 1: us soft tissue head/neck · 0.07mm/px · 11 of 11 slices shown]
[im 1/11]
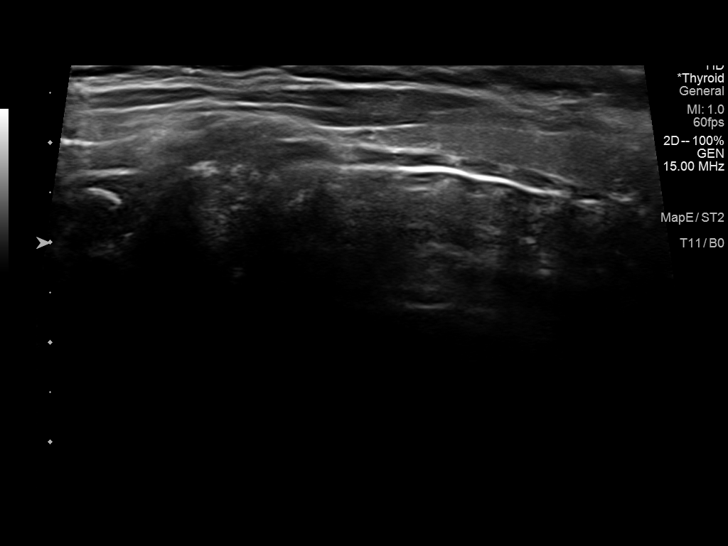
[im 2/11]
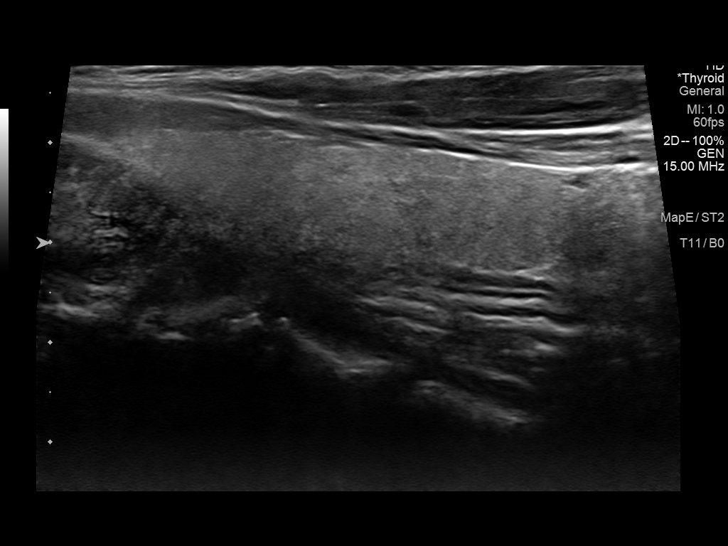
[im 3/11]
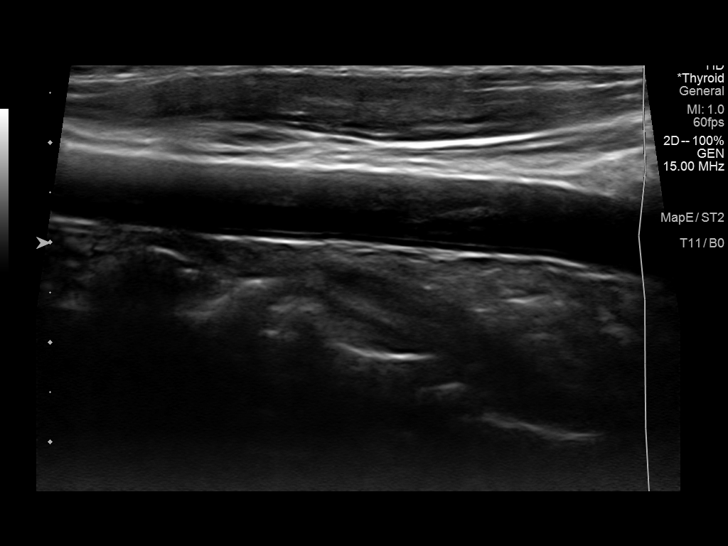
[im 4/11]
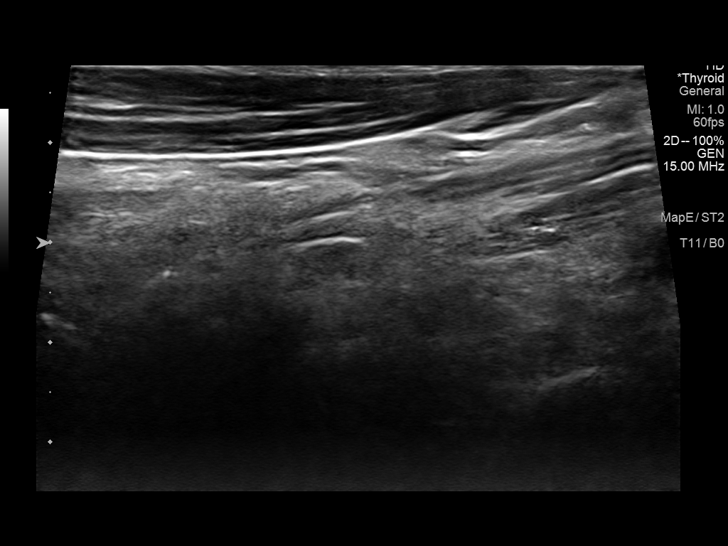
[im 5/11]
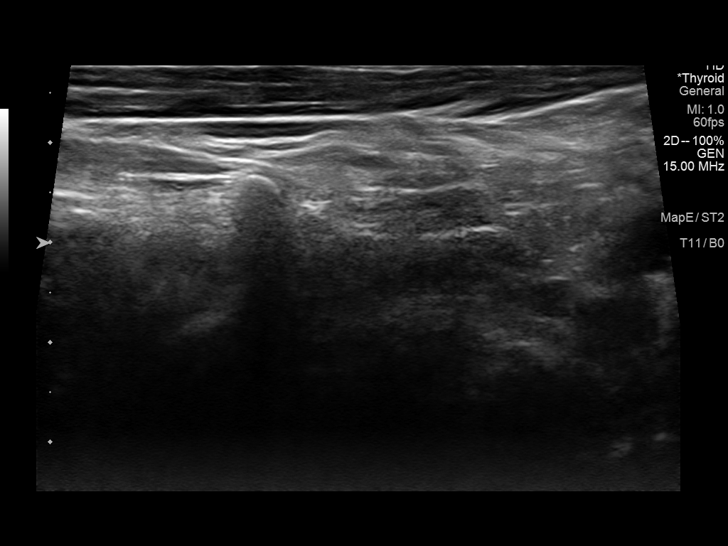
[im 6/11]
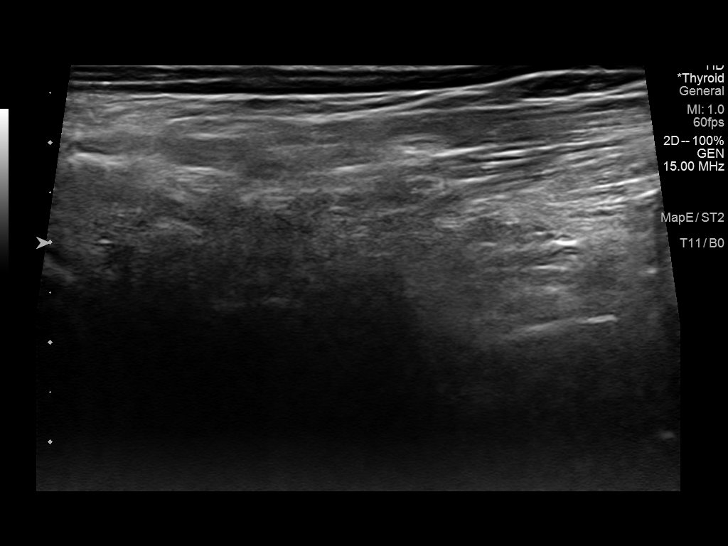
[im 7/11]
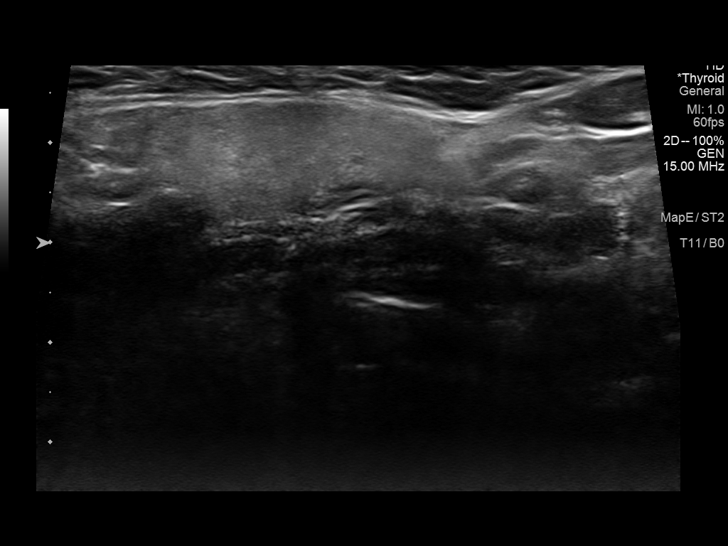
[im 8/11]
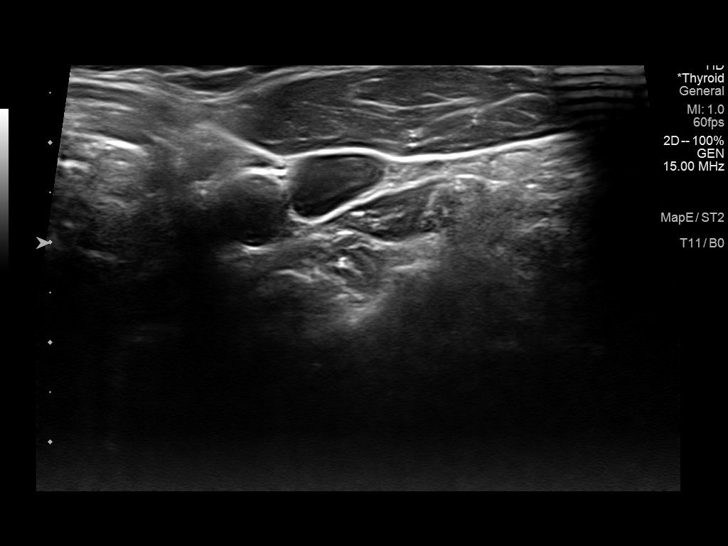
[im 9/11]
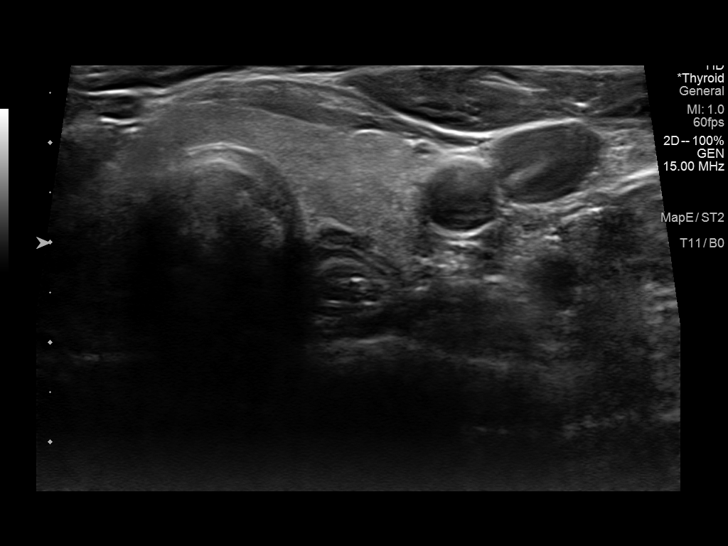
[im 10/11]
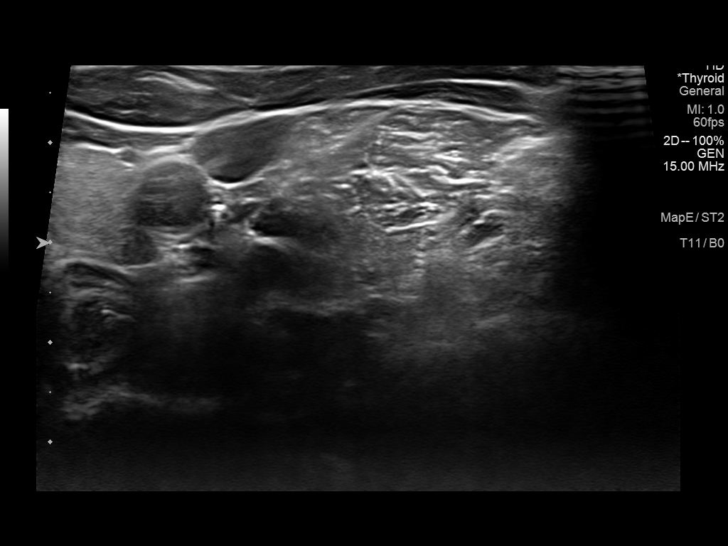
[im 11/11]
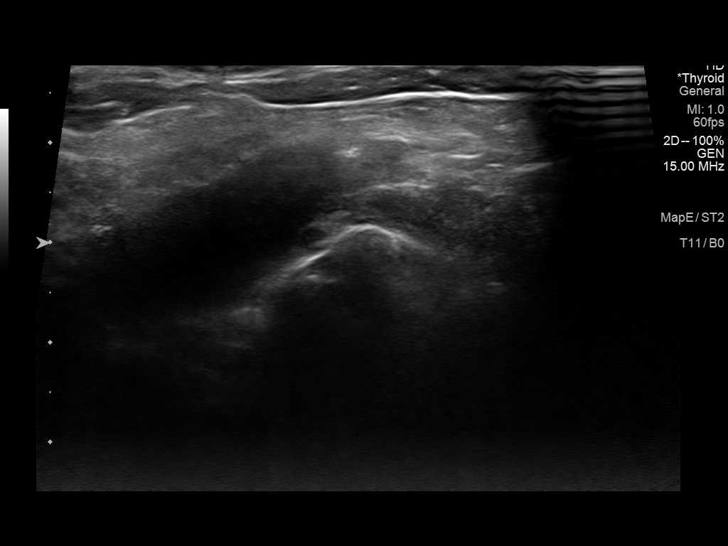

[11 of 11 positions shown; findings below may reference images not displayed]

FINDINGS: Sonographic interrogation of the soft tissues of the left neck
demonstrates a mildly heterogeneous left thyroid gland, normal
subcutaneous adipose tissue, muscles and vascular structures. No
evidence of focal abnormality, mass or cystic lesion.,
IMPRESSION: Negative sonographic survey of the left neck.

## 2019-07-24 ENCOUNTER — Other Ambulatory Visit: Payer: Self-pay | Admitting: Family Medicine

## 2019-07-24 DIAGNOSIS — Z1231 Encounter for screening mammogram for malignant neoplasm of breast: Secondary | ICD-10-CM

## 2019-07-29 ENCOUNTER — Ambulatory Visit
Admission: RE | Admit: 2019-07-29 | Discharge: 2019-07-29 | Disposition: A | Discharge status: Home / Self Care | Payer: BLUE CROSS/BLUE SHIELD | Source: Ambulatory Visit | Attending: Family Medicine | Admitting: Family Medicine

## 2019-07-29 DIAGNOSIS — Z1231 Encounter for screening mammogram for malignant neoplasm of breast: Secondary | ICD-10-CM | POA: Diagnosis not present

## 2019-12-03 ENCOUNTER — Other Ambulatory Visit: Payer: Self-pay | Admitting: Orthopedic Surgery

## 2019-12-03 DIAGNOSIS — S83104A Unspecified dislocation of right knee, initial encounter: Secondary | ICD-10-CM

## 2019-12-03 DIAGNOSIS — M25561 Pain in right knee: Secondary | ICD-10-CM

## 2019-12-10 ENCOUNTER — Other Ambulatory Visit: Payer: Self-pay | Admitting: Orthopedic Surgery

## 2019-12-10 DIAGNOSIS — G8929 Other chronic pain: Secondary | ICD-10-CM

## 2019-12-10 DIAGNOSIS — M4317 Spondylolisthesis, lumbosacral region: Secondary | ICD-10-CM

## 2019-12-10 DIAGNOSIS — M5442 Lumbago with sciatica, left side: Secondary | ICD-10-CM

## 2019-12-10 DIAGNOSIS — M5136 Other intervertebral disc degeneration, lumbar region: Secondary | ICD-10-CM

## 2019-12-17 ENCOUNTER — Ambulatory Visit: Payer: BLUE CROSS/BLUE SHIELD

## 2020-08-09 IMAGING — MG DIGITAL SCREENING BILAT W/ TOMO W/ CAD
8 series · 8 of 24 positions shown · non-contrast
Comparison: Previous exam(s).

CLINICAL DATA: Screening.

EXAM:
DIGITAL SCREENING BILATERAL MAMMOGRAM WITH TOMO AND CAD

[L CC synth-2D]
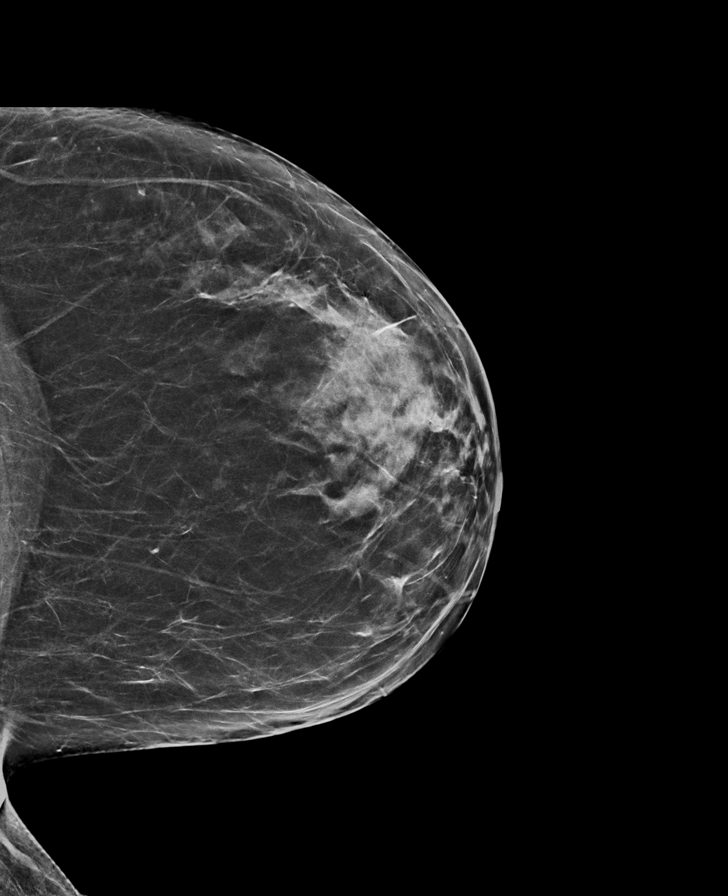

[L MLO synth-2D]
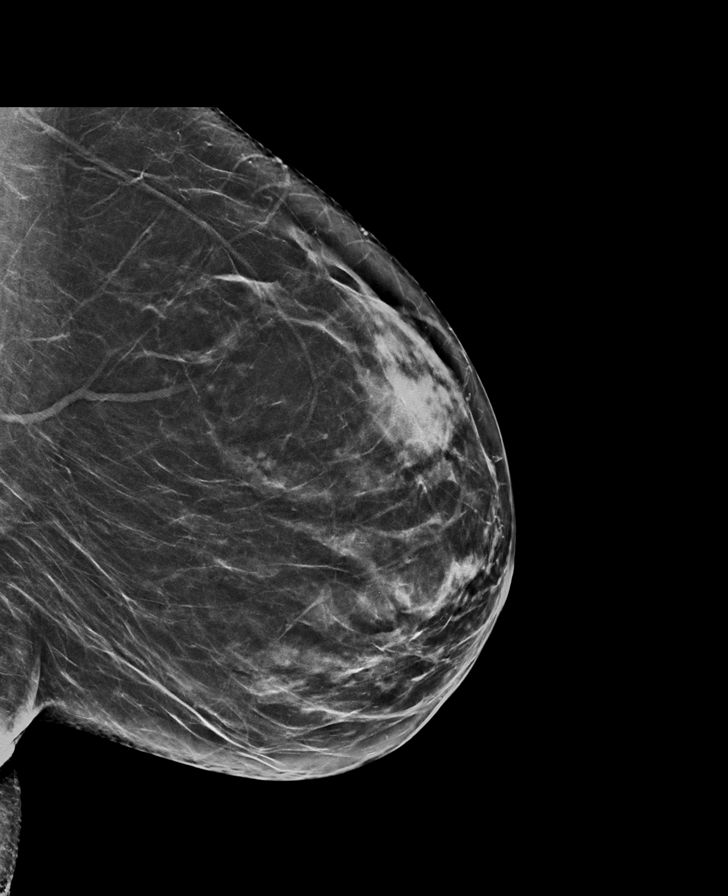

[R MLO synth-2D]
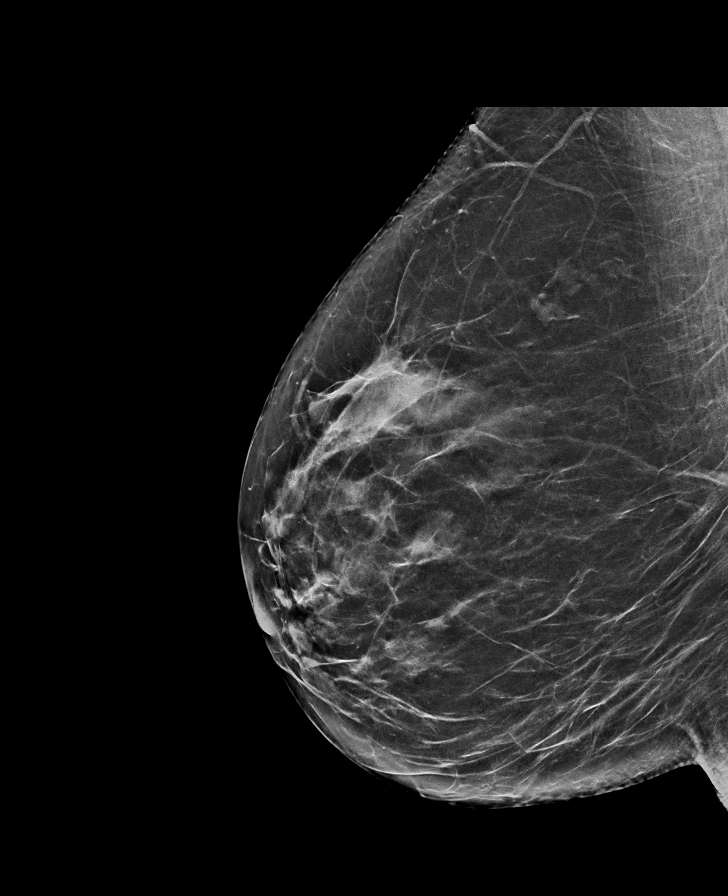

[R CC synth-2D]
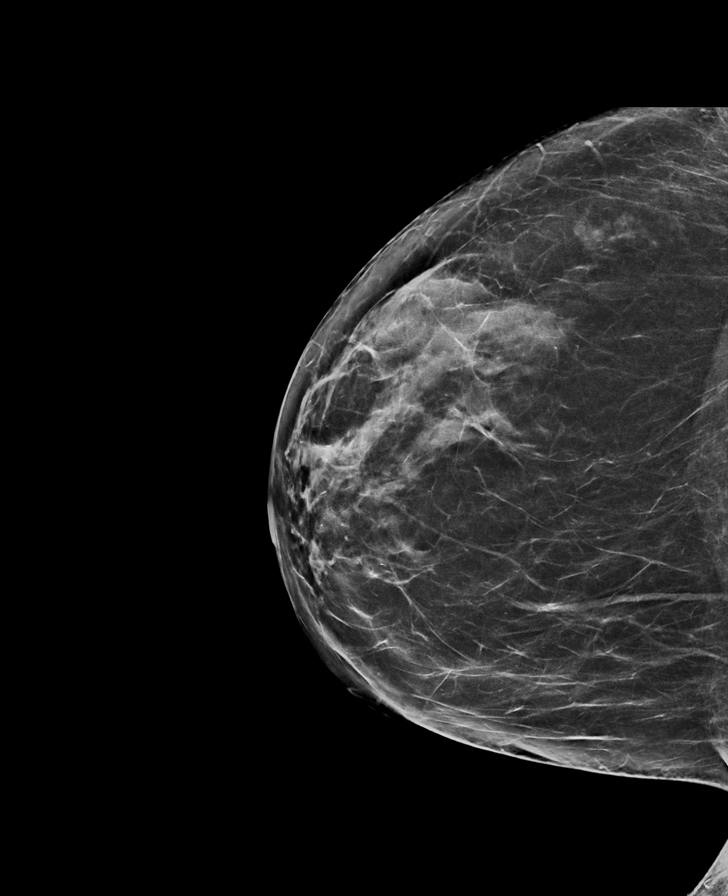

[L MLO tomo · tomo slice 35/68.0]
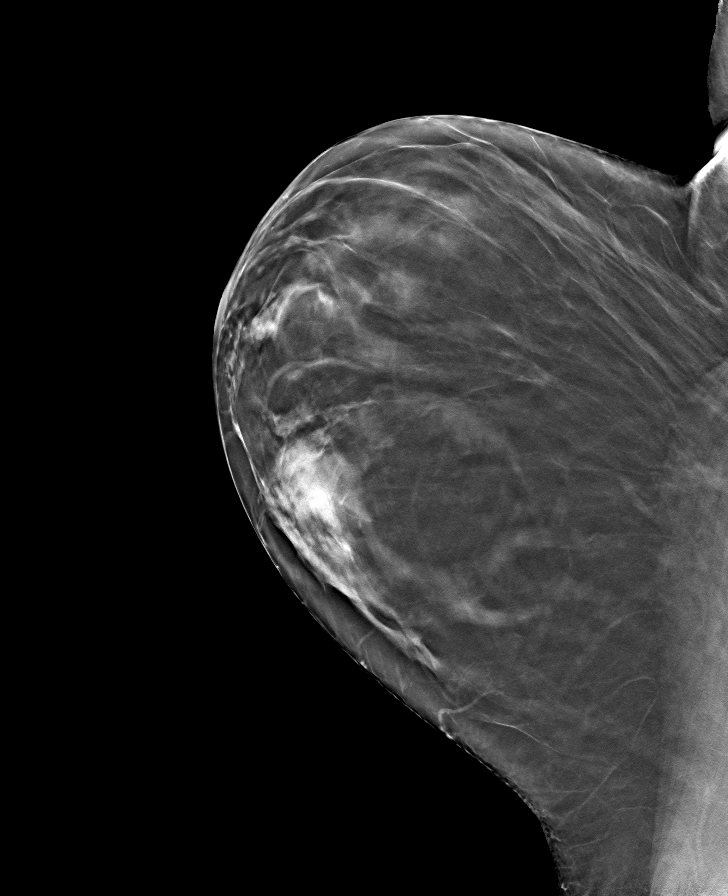

[R MLO tomo · tomo slice 34/67.0]
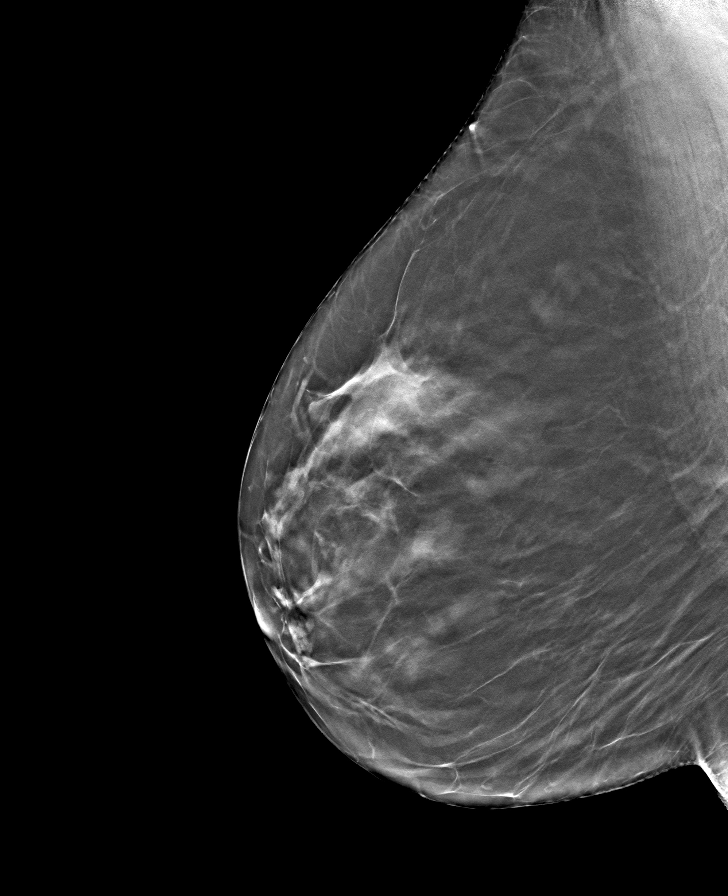

[L CC tomo · tomo slice 35/70.0]
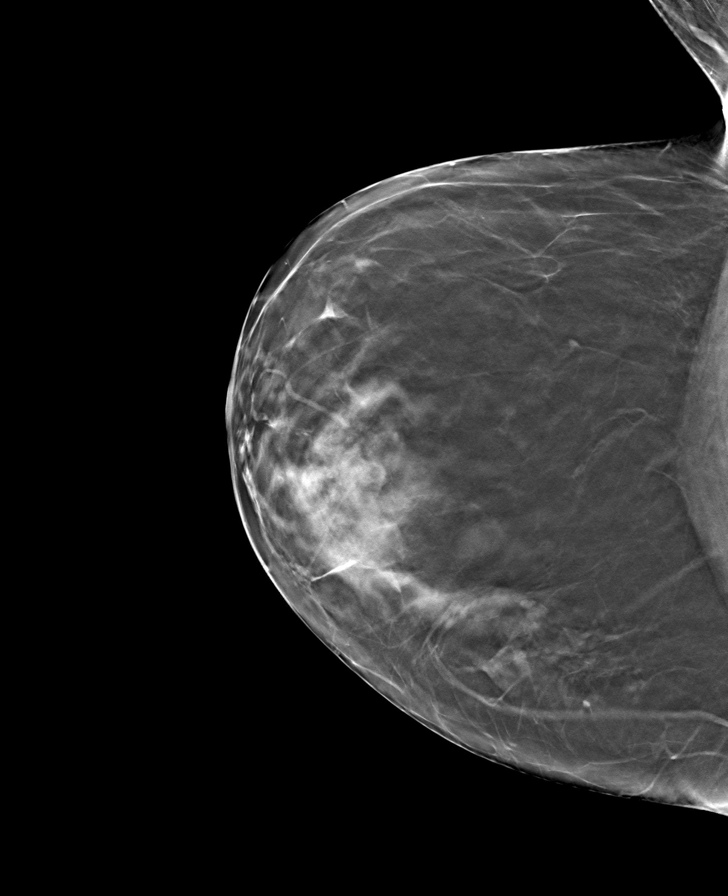

[R CC tomo · tomo slice 37/72.0]
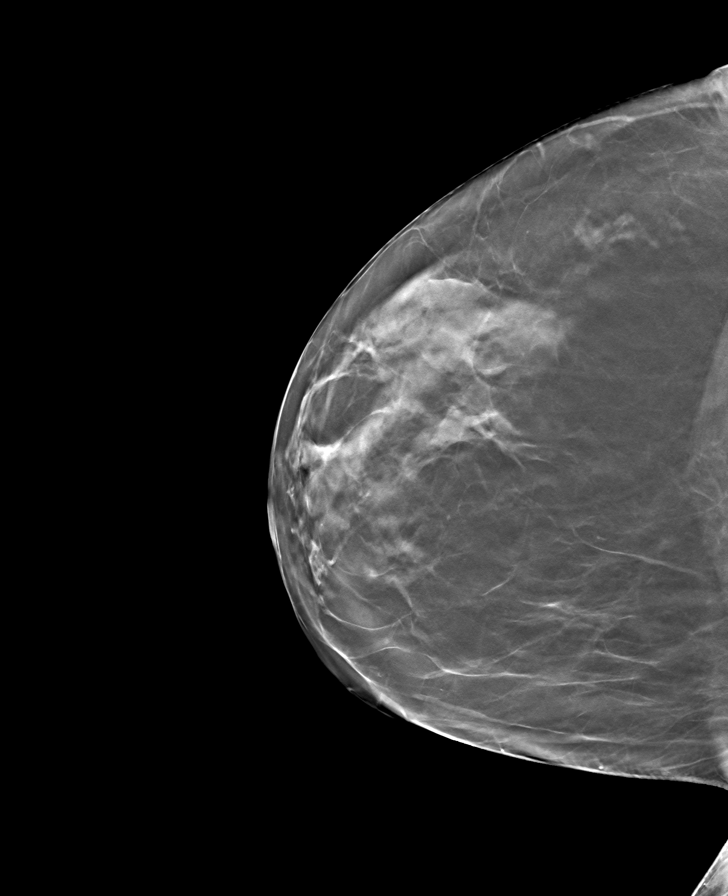

[8 of 24 positions shown; findings below may reference images not displayed]

ACR Breast Density Category c: The breast tissue is heterogeneously
dense, which may obscure small masses.
FINDINGS: There are no findings suspicious for malignancy. Images were
processed with CAD.
IMPRESSION: No mammographic evidence of malignancy. A result letter of this
screening mammogram will be mailed directly to the patient.

RECOMMENDATION:
Screening mammogram in one year. (Code:[id number]-U-LHB)

BI-RADS CATEGORY  1: Negative.

## 2020-10-28 IMAGING — US US CAROTID DUPLEX BILAT
1 series · 13 of 24 positions shown · non-contrast
Comparison: Prior thyroid ultrasound 12/15/2016

CLINICAL DATA: 52-year-old female with dizziness after neck
extension, possible mass on the left side of the neck.

EXAM:
BILATERAL CAROTID DUPLEX ULTRASOUND
TECHNIQUE: Gray scale imaging, color Doppler and duplex ultrasound were
performed of bilateral carotid and vertebral arteries in the neck.

[Series 1: us carotid duplex bilat · 13 of 65 slices shown]
[im 1/65]
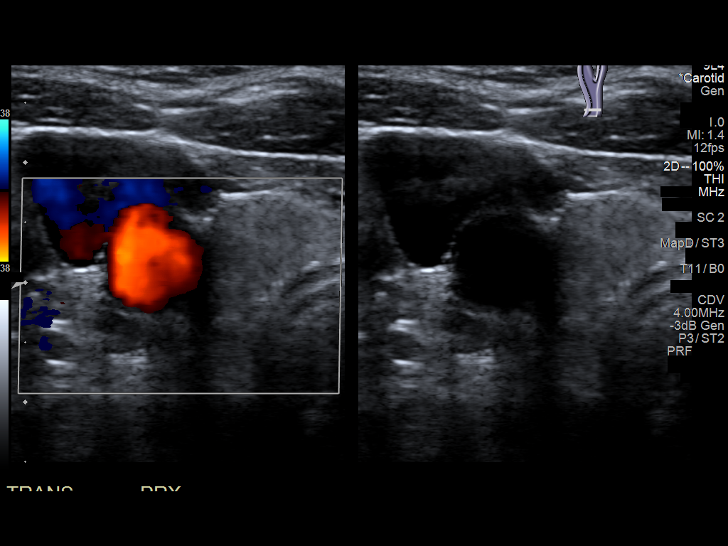
[im 6/65]
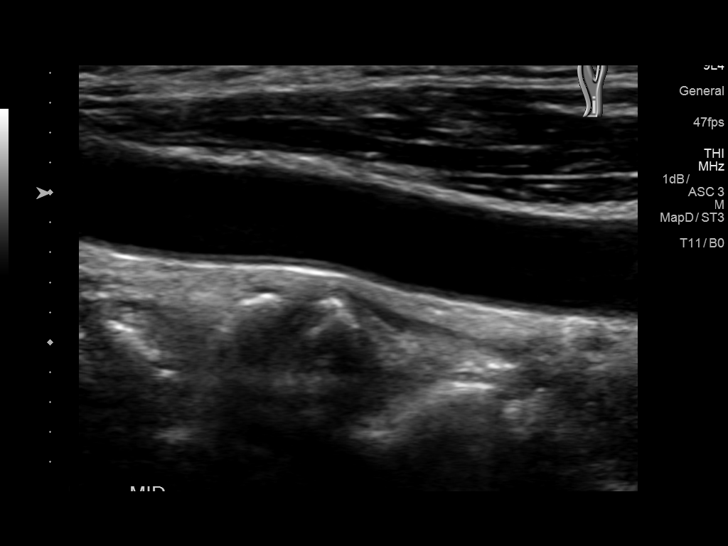
[im 12/65]
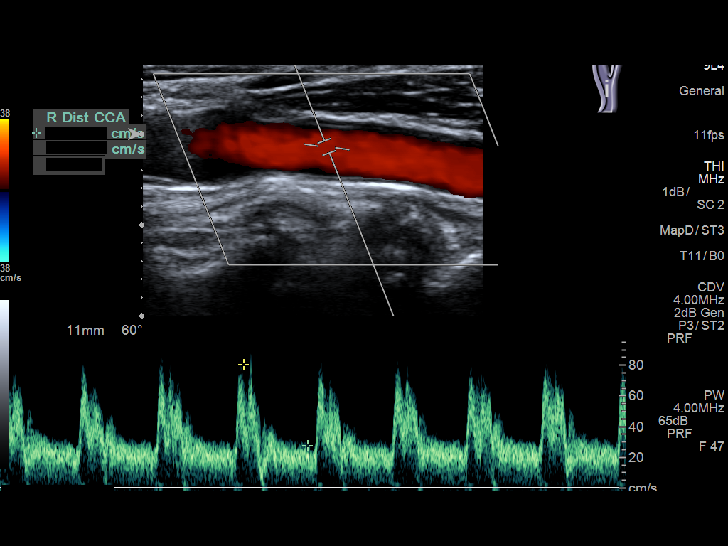
[im 17/65]
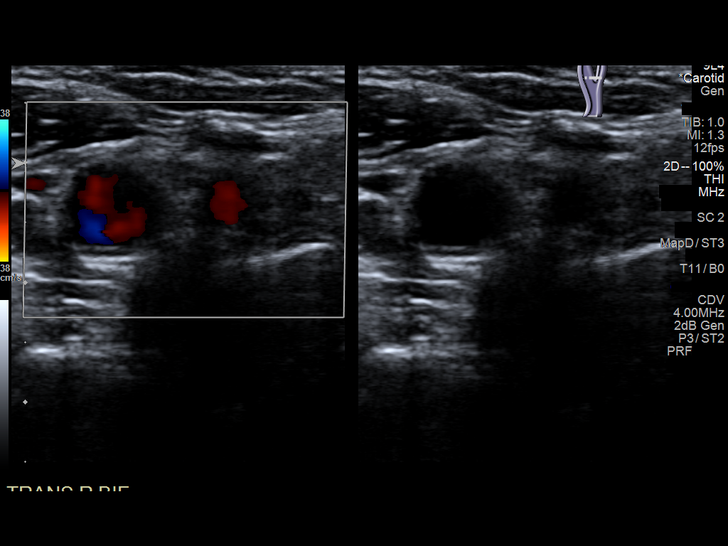
[im 23/65]
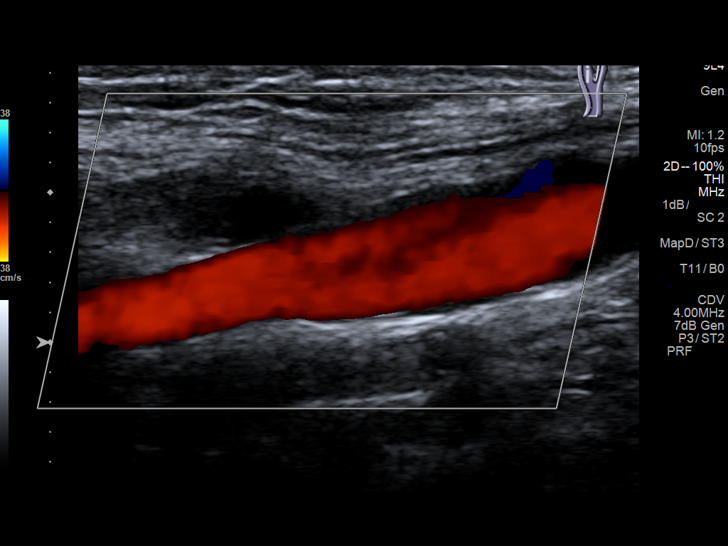
[im 28/65]
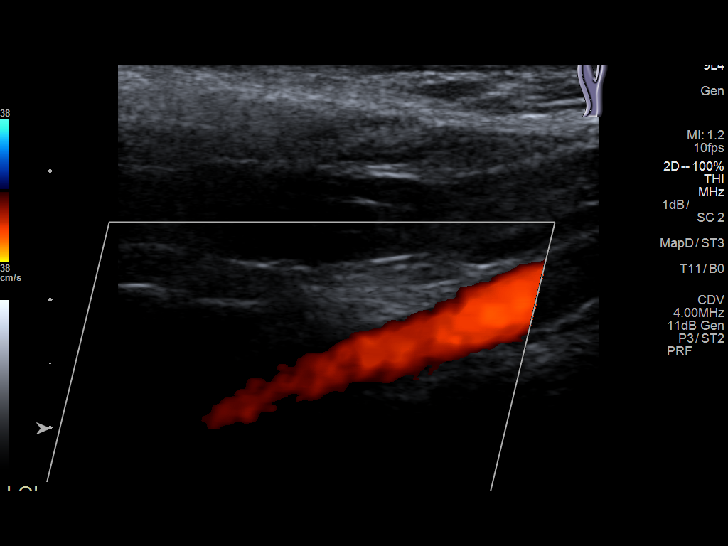
[im 34/65]
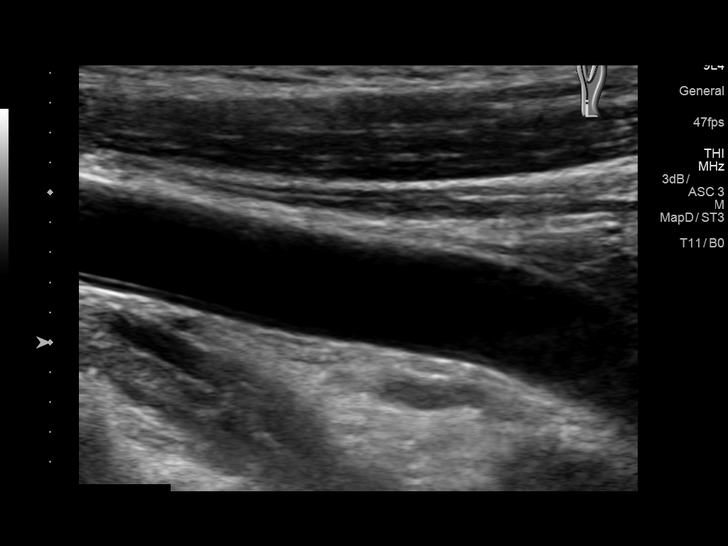
[im 37/65]
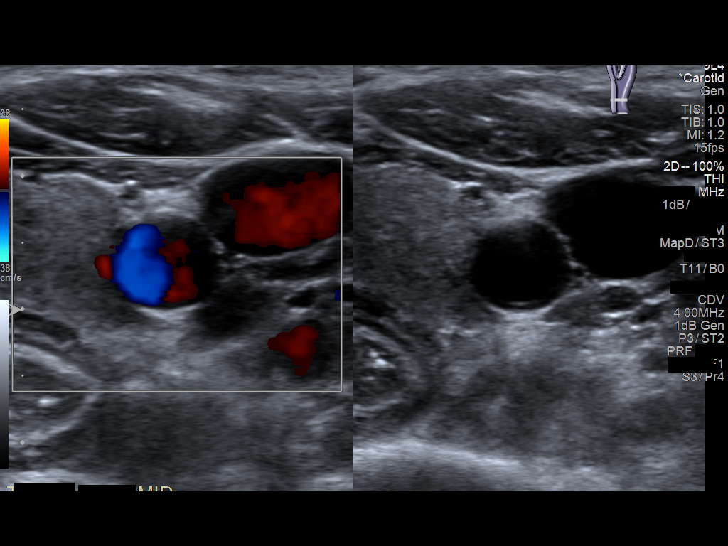
[im 42/65]
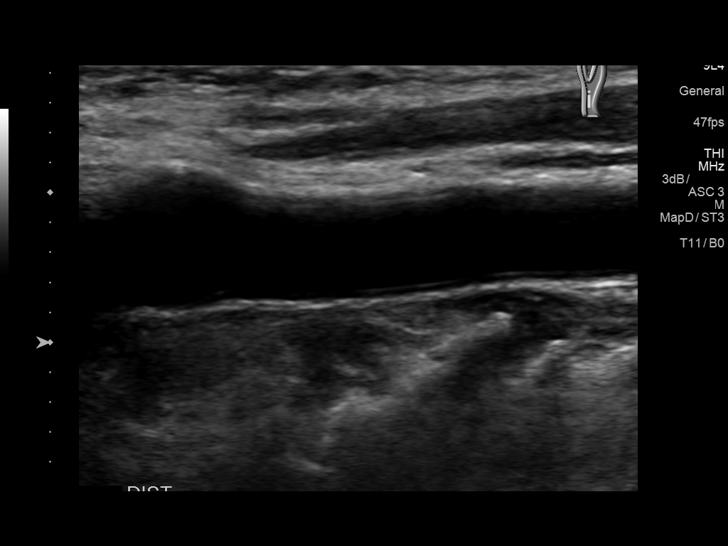
[im 48/65]
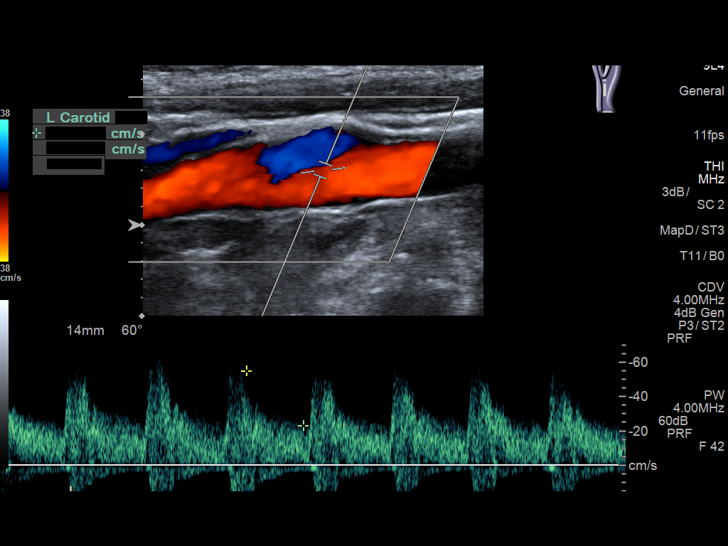
[im 53/65]
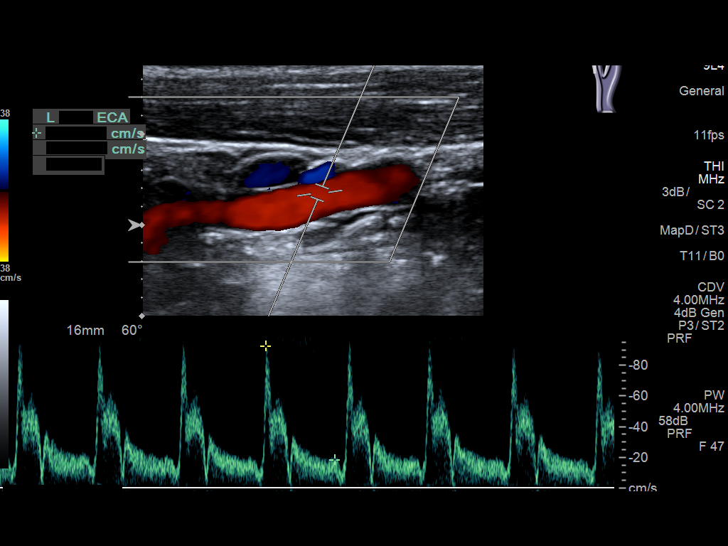
[im 59/65]
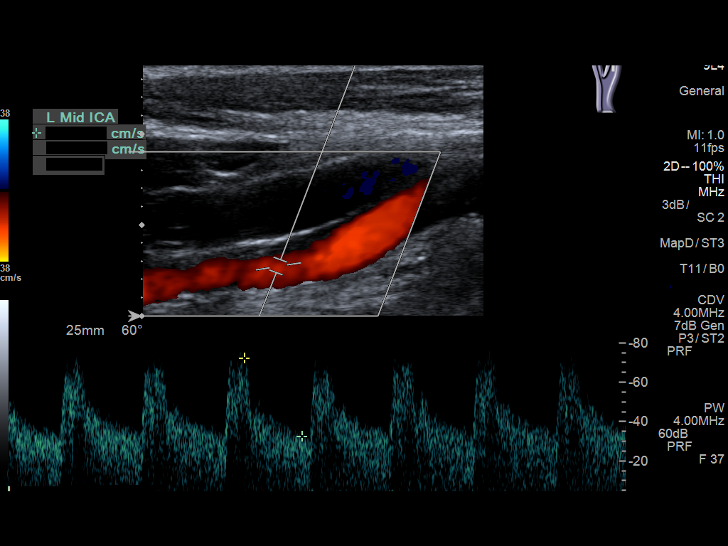
[im 65/65]
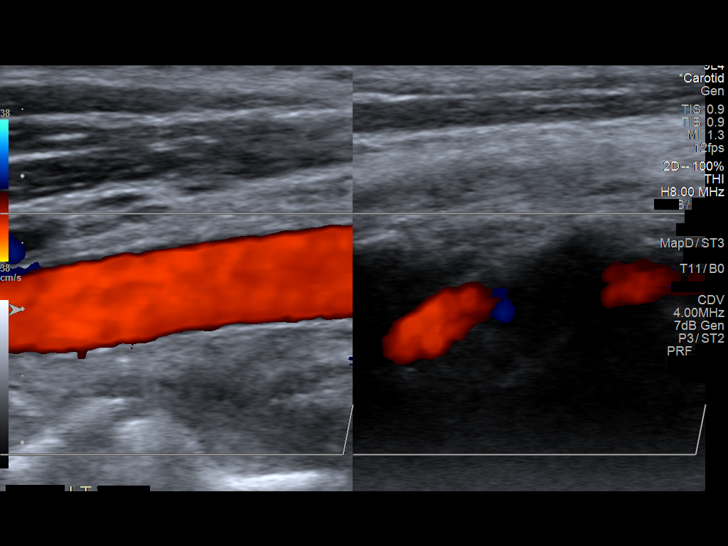

[13 of 24 positions shown; findings below may reference images not displayed]

FINDINGS: Criteria: Quantification of carotid stenosis is based on velocity
parameters that correlate the residual internal carotid diameter
with NASCET-based stenosis levels, using the diameter of the distal
internal carotid lumen as the denominator for stenosis measurement.

The following velocity measurements were obtained:

RIGHT
ICA: 86/48 cm/sec
CCA: 81/28 cm/sec

SYSTOLIC ICA/CCA RATIO:

ECA:  85 cm/sec

LEFT

ICA: 72/33 cm/sec

CCA: 91/29 cm/sec

SYSTOLIC ICA/CCA RATIO:

ECA:  93 cm/sec

RIGHT CAROTID ARTERY: No significant atherosclerotic plaque or
evidence of stenosis in the internal carotid artery.

RIGHT VERTEBRAL ARTERY:  Patent with normal antegrade flow.

LEFT CAROTID ARTERY: No significant atherosclerotic plaque or
evidence of stenosis in the internal carotid artery.

LEFT VERTEBRAL ARTERY:  Patent with normal antegrade flow.
IMPRESSION: Normal bilateral carotid duplex ultrasound.

## 2020-11-17 ENCOUNTER — Other Ambulatory Visit: Payer: Self-pay | Admitting: Family Medicine

## 2020-11-17 DIAGNOSIS — Z1231 Encounter for screening mammogram for malignant neoplasm of breast: Secondary | ICD-10-CM

## 2020-12-17 ENCOUNTER — Ambulatory Visit
Admission: RE | Admit: 2020-12-17 | Discharge: 2020-12-17 | Disposition: A | Discharge status: Home / Self Care | Payer: BLUE CROSS/BLUE SHIELD | Source: Ambulatory Visit | Attending: Family Medicine | Admitting: Family Medicine

## 2020-12-17 ENCOUNTER — Other Ambulatory Visit: Payer: Self-pay

## 2020-12-17 DIAGNOSIS — Z1231 Encounter for screening mammogram for malignant neoplasm of breast: Secondary | ICD-10-CM | POA: Insufficient documentation

## 2021-12-21 ENCOUNTER — Other Ambulatory Visit: Payer: Self-pay | Admitting: Family Medicine

## 2021-12-21 DIAGNOSIS — Z1231 Encounter for screening mammogram for malignant neoplasm of breast: Secondary | ICD-10-CM

## 2022-01-03 ENCOUNTER — Other Ambulatory Visit: Payer: Self-pay | Admitting: Internal Medicine

## 2022-01-03 DIAGNOSIS — R079 Chest pain, unspecified: Secondary | ICD-10-CM

## 2022-01-12 ENCOUNTER — Telehealth (HOSPITAL_COMMUNITY): Payer: Self-pay | Admitting: *Deleted

## 2022-01-12 ENCOUNTER — Telehealth (HOSPITAL_COMMUNITY): Payer: Self-pay | Admitting: Emergency Medicine

## 2022-01-12 DIAGNOSIS — R079 Chest pain, unspecified: Secondary | ICD-10-CM

## 2022-01-12 MED ORDER — METOPROLOL TARTRATE 25 MG PO TABS: 25.0000 mg | ORAL_TABLET | Freq: Once | ORAL | 0 refills | Status: AC

## 2022-01-12 NOTE — Telephone Encounter (Signed)
Reaching out to patient to offer assistance regarding upcoming cardiac imaging study; pt verbalizes understanding of appt date/time, parking situation and where to check in, pre-test NPO status and medications ordered, and verified current allergies; name and call back number provided for further questions should they arise Marchia Bond RN Navigator Cardiac Imaging [facility] [phone] office [phone] cell  '25mg'$  metoprolol to [facility] [place] Arrival 40 [facility] Denies iv issues Holding HCTZ

## 2022-01-12 NOTE — Telephone Encounter (Signed)
Attempted to call patient regarding upcoming cardiac CT appointment. °Left message on voicemail with name and callback number ° °Quade Ramirez RN Navigator Cardiac Imaging °[facility] °[phone] Office °[phone] Cell ° °

## 2022-01-17 ENCOUNTER — Ambulatory Visit
Admission: RE | Admit: 2022-01-17 | Discharge: 2022-01-17 | Disposition: A | Discharge status: Home / Self Care | Payer: BC Managed Care – PPO | Source: Ambulatory Visit | Attending: Internal Medicine | Admitting: Internal Medicine

## 2022-01-17 DIAGNOSIS — R079 Chest pain, unspecified: Secondary | ICD-10-CM | POA: Insufficient documentation

## 2022-01-17 MED ORDER — IOHEXOL 350 MG/ML SOLN
100.0000 mL | Freq: Once | INTRAVENOUS | Status: AC | PRN
  Administered 2022-01-17: 100 mL via INTRAVENOUS

## 2022-01-17 MED ORDER — NITROGLYCERIN 0.4 MG SL SUBL: 0.8000 mg | SUBLINGUAL_TABLET | Freq: Once | SUBLINGUAL | Status: AC

## 2022-01-17 MED ORDER — NITROGLYCERIN 0.4 MG SL SUBL
SUBLINGUAL_TABLET | SUBLINGUAL | Status: AC
  Administered 2022-01-17: 0.8 mg via SUBLINGUAL
  Filled 2022-01-17: qty 1

## 2022-01-17 MED ORDER — NITROGLYCERIN 0.4 MG SL SUBL
SUBLINGUAL_TABLET | SUBLINGUAL | Status: AC
  Filled 2022-01-17: qty 1

## 2022-01-17 NOTE — Progress Notes (Signed)
Patient tolerated procedure well. Ambulate w/o difficulty. Denies any lightheadedness or being dizzy. Pt denies any pain at this time.  Pt is encouraged to drink additional water throughout the day and reason explained to patient. Patient verbalized understanding and all questions answered. ABC intact. No further needs at this time. Discharge from procedure area w/o issues.

## 2022-02-24 ENCOUNTER — Ambulatory Visit
Admission: RE | Admit: 2022-02-24 | Discharge: 2022-02-24 | Disposition: A | Discharge status: Home / Self Care | Payer: BC Managed Care – PPO | Source: Ambulatory Visit | Attending: Family Medicine | Admitting: Family Medicine

## 2022-02-24 DIAGNOSIS — Z1231 Encounter for screening mammogram for malignant neoplasm of breast: Secondary | ICD-10-CM | POA: Diagnosis not present

## 2022-08-11 ENCOUNTER — Other Ambulatory Visit: Payer: Self-pay | Admitting: Orthopedic Surgery

## 2022-08-11 DIAGNOSIS — M5417 Radiculopathy, lumbosacral region: Secondary | ICD-10-CM

## 2022-09-01 ENCOUNTER — Ambulatory Visit
Admission: RE | Admit: 2022-09-01 | Discharge: 2022-09-01 | Disposition: A | Discharge status: Home / Self Care | Payer: BC Managed Care – PPO | Source: Ambulatory Visit | Attending: Orthopedic Surgery | Admitting: Orthopedic Surgery

## 2022-09-01 DIAGNOSIS — M5417 Radiculopathy, lumbosacral region: Secondary | ICD-10-CM

## 2023-01-05 ENCOUNTER — Encounter: Payer: Self-pay | Admitting: Orthopedic Surgery

## 2023-11-20 ENCOUNTER — Other Ambulatory Visit: Payer: Self-pay | Admitting: Family Medicine

## 2023-11-20 DIAGNOSIS — Z1231 Encounter for screening mammogram for malignant neoplasm of breast: Secondary | ICD-10-CM

## 2023-11-30 ENCOUNTER — Ambulatory Visit
Admission: RE | Admit: 2023-11-30 | Discharge: 2023-11-30 | Disposition: A | Discharge status: Home / Self Care | Source: Ambulatory Visit | Attending: Family Medicine | Admitting: Family Medicine

## 2023-11-30 DIAGNOSIS — Z1231 Encounter for screening mammogram for malignant neoplasm of breast: Secondary | ICD-10-CM | POA: Diagnosis present
